# Patient Record
Sex: Male | Born: 1960 | Marital: Married | Smoking: Never smoker
Health system: Southern US, Community
[De-identification: ages and names within clinical notes are randomized; demographics above are authoritative.]

## PROBLEM LIST (undated history)

## (undated) DIAGNOSIS — I10 Essential (primary) hypertension: Secondary | ICD-10-CM

## (undated) DIAGNOSIS — K219 Gastro-esophageal reflux disease without esophagitis: Secondary | ICD-10-CM

## (undated) DIAGNOSIS — M1990 Unspecified osteoarthritis, unspecified site: Secondary | ICD-10-CM

## (undated) DIAGNOSIS — T7840XA Allergy, unspecified, initial encounter: Secondary | ICD-10-CM

## (undated) HISTORY — PX: KNEE ARTHROSCOPY: SUR90

## (undated) HISTORY — PX: ANKLE FRACTURE SURGERY: SHX122

## (undated) HISTORY — DX: Allergy, unspecified, initial encounter: T78.40XA

## (undated) HISTORY — PX: COLONOSCOPY: SHX174

## (undated) HISTORY — DX: Gastro-esophageal reflux disease without esophagitis: K21.9

## (undated) HISTORY — DX: Unspecified osteoarthritis, unspecified site: M19.90

## (undated) HISTORY — DX: Essential (primary) hypertension: I10

---

## 2001-05-23 ENCOUNTER — Emergency Department (HOSPITAL_COMMUNITY): Admission: EM | Admit: 2001-05-23 | Discharge: 2001-05-23 | Payer: Self-pay | Admitting: *Deleted

## 2001-07-29 ENCOUNTER — Encounter: Admission: RE | Admit: 2001-07-29 | Discharge: 2001-10-27 | Payer: Self-pay | Admitting: Orthopedic Surgery

## 2002-06-21 ENCOUNTER — Emergency Department (HOSPITAL_COMMUNITY): Admission: EM | Admit: 2002-06-21 | Discharge: 2002-06-21 | Payer: Self-pay | Admitting: Emergency Medicine

## 2002-06-28 ENCOUNTER — Emergency Department (HOSPITAL_COMMUNITY): Admission: EM | Admit: 2002-06-28 | Discharge: 2002-06-28 | Payer: Self-pay | Admitting: Emergency Medicine

## 2006-03-29 ENCOUNTER — Emergency Department (HOSPITAL_COMMUNITY): Admission: EM | Admit: 2006-03-29 | Discharge: 2006-03-29 | Payer: Self-pay | Admitting: Family Medicine

## 2006-04-12 ENCOUNTER — Emergency Department (HOSPITAL_COMMUNITY): Admission: EM | Admit: 2006-04-12 | Discharge: 2006-04-12 | Payer: Self-pay | Admitting: Family Medicine

## 2008-01-16 ENCOUNTER — Ambulatory Visit: Payer: Self-pay | Admitting: Gastroenterology

## 2008-01-27 ENCOUNTER — Ambulatory Visit: Payer: Self-pay | Admitting: Gastroenterology

## 2008-07-28 ENCOUNTER — Ambulatory Visit: Payer: Self-pay | Admitting: Gastroenterology

## 2008-07-28 DIAGNOSIS — K219 Gastro-esophageal reflux disease without esophagitis: Secondary | ICD-10-CM | POA: Insufficient documentation

## 2008-07-29 ENCOUNTER — Telehealth: Payer: Self-pay | Admitting: Gastroenterology

## 2008-07-29 ENCOUNTER — Encounter: Payer: Self-pay | Admitting: Gastroenterology

## 2010-10-11 NOTE — Miscellaneous (Signed)
 Summary: nexium   Clinical Lists Changes  Medications: Rx of NEXIUM 40 MG  CPDR (ESOMEPRAZOLE MAGNESIUM) 1 capsule each day 30 minutes before meal;  #30 x 11;  Signed;  Entered by: Milus Alpha CMA;  Authorized by: Janel Medford MD;  Method used: Electronically to [facility] # [phone]*, [address] [place], [place], [place]  [address], Ph: [phone], Fax: [phone]    Prescriptions: NEXIUM 40 MG  CPDR (ESOMEPRAZOLE MAGNESIUM) 1 capsule each day 30 minutes before meal  #30 x 11   Entered by:   Milus Alpha CMA   Authorized by:   Janel Medford MD   Signed by:   Milus Alpha CMA on 07/29/2008   Method used:   Electronically to        [facility] # [id number][facility] (retail)       [address] [place], [place]  [address]       Ph: [phone]       Fax: [phone]   RxID:   [id number]

## 2010-10-11 NOTE — Progress Notes (Signed)
 Summary: NEXIUM  Phone Note Call from Patient Call back at [phone]   Caller: Patient Call For: Percival Glasheen Reason for Call: Refill Medication Details for Reason: NEXIUM Summary of Call: PT WAS IN YEST FOR APPT AND NEXIUM [facility] [address]...PT SAID IT IS NOT THERE FOR PICK UP  AND PHARMACY SAID NEVER RECEIVED ORDER...PLEASE SEND AND CALL PT WHEN DONE Initial call taken by: Helena Loach,  July 29, 2008 11:57 AM      Prescriptions: NEXIUM 40 MG  CPDR (ESOMEPRAZOLE MAGNESIUM) 1 capsule each day 30 minutes before meal  #30 x 11   Entered by:   Milus Alpha CMA   Authorized by:   Janel Medford MD   Signed by:   Milus Alpha CMA on 07/29/2008   Method used:   Electronically to        [facility] [address] [facility]* (retail)       [address]       [place], [place]  [address]       Ph: [phone] or [phone]       Fax: [phone]   RxID:   [id number]   Appended Document: NEXIUM refill to wrong pharmacy called and cancelled [facility] and [facility] [facility] aware

## 2010-10-11 NOTE — Assessment & Plan Note (Signed)
 History of Present Illness Visit Type: consult Primary GI MD: Hoyt Macleod MD Primary Provider: Rosslyn Coons, MD Requesting Provider: Rosslyn Coons, MD Chief Complaint: acid reflux History of Present Illness:    very pleasant 50 year old man who has had Belching, severe pyrosis, almost feels likely a mild SOB.  Feels this nearly daily.  Was given samples of PPI, these helped alot.  Symptoms returned when he stopped the med samples.  First noted these symptoms summer 2008.  Certain foods (strawberry, fruits) would cause bloating, nause.  No dsyphagia, no odynophagia.  Stable weight.  No vomitting, no overt bleeding.  Rare NSAIDs.  Eats dinner around 7-8, lays down around 11am.               Updated Prior Medication List: ASPIRIN 81 MG  TABS (ASPIRIN) 1 by mouth once daily CLARITIN 10 MG CAPS (LORATADINE) 1 by mouth every other day BENICAR 40 MG TABS (OLMESARTAN MEDOXOMIL) once daily  Current Allergies (reviewed today): ! PCN  Past Medical History:    Hemorrhoids    hypertension  Past Surgical History:    colonoscopy 2009 by Dr. Alisia Irons no polyps or cancers. Next colonoscopy 2019.   Family History:    paternal grandfather had colon cancer  Social History:    he is married has 2 children he works as a Psychologist, occupational for [employer] he does not smoke cigarettes, he does not drink alcohol, he drinks 2 sodas a day.    Review of Systems       Pertinent positive and negative review of systems were noted in the above HPI and GI specific review of systems.  All other review of systems was otherwise negative.    Vital Signs:  Patient Profile:   49 Years Old Male Height:     73 inches Weight:      202.50 pounds BMI:     26.81 Pulse rate:   60 / minute Pulse rhythm:   regular BP sitting:   142 / 100  (left arm)  Vitals Entered By: June McMurray CMA (July 28, 2008 2:27 PM)                  Physical Exam  Constitutional: generally well appearing Psychiatric:  alert and oriented times 3 Eyes: extraocular movements intact Mouth: oropharynx moist, no lesions Neck: supple, no lymphadenopathy Cardiovascular: heart regular rate and rythm Lungs: CTA bilaterally Abdomen: soft, non-tender, non-distended, no obvious ascites, no peritoneal signs, normal bowel sounds Extremities: no lower extremity edema bilaterally Skin: no lesions on visible extremities     Impression & Recommendations:  Problem # 1:  GERD (ICD-530.81) fairly classic GERD symptoms that seem to be well controlled on PPI once daily. I will call him in a prescription for Nexium one pill once daily to be taken 20-30 minutes prior to his breakfast meal. He has been given a GERD handout and we will arrange for him to have an EGD performed at his soonest convenience to screen for complications of GERarrett's, strictures, cancer.   Patient Instructions: 1)  GERD handout. 2)  Start nexium one pill once daily, best taken 20-30 min before breakfast meal. 3)  You will be scheduled to have an upper endoscopy. 4)  A copy of this information will be sent to Dr. Efraim Grange.    Prescriptions: NEXIUM 40 MG  CPDR (ESOMEPRAZOLE MAGNESIUM) 1 capsule each day 30 minutes before meal  #30 x 11   Entered and Authorized by:   Janel Medford MD  Signed by:   Janel Medford MD on 07/28/2008   Method used:   Historical   RxID:   [id number]  ]  Appended Document: Orders Update/EGD    Clinical Lists Changes  Orders: Added new Test order of EGD (EGD) - Signed

## 2010-10-11 NOTE — Procedures (Signed)
 Summary: colonscopy   Colonoscopy  Procedure date:  01/27/2008  Findings:      Location:  [facility].    Procedures Next Due Date:    Colonoscopy: 01/2018  Patient Name: Drew Martinez, Drew Martinez MRN:  Procedure Procedures: Colonoscopy CPT: 16109.  Personnel: Endoscopist: Janel Medford, MD.  Referred By: Cris Dollar. Efraim Grange, MD.  Exam Location: Exam performed in Endoscopy Suite. Outpatient  Patient Consent: Procedure, Alternatives, Risks and Benefits discussed, consent obtained, from patient. Consent was obtained by the RN.  Indications Symptoms: Hematochezia.  Comments: INTERMITTENT (RARE, 1-2 TIMES/YEAR) RECTAL BLEEDING, OFTEN AROUND TIME OF CONTIPATION History  Current Medications: Patient is not currently taking Coumadin.  Comments: Patient history reviewed and updated, pre-procedure physical performed prior to initiation of sedation? yes Pre-Exam Physical: Performed Jan 27, 2008. Cardio-pulmonary exam, Abdominal exam, Mental status exam WNL.  Exam Exam: Extent of exam reached: Cecum, extent intended: Cecum.  The cecum was identified by appendiceal orifice and IC valve. Patient position: on left side. Time to Cecum: 00:02:22. Time for Withdrawl: 00:06:08. Colon retroflexion performed. Images taken. ASA Classification: II. Tolerance: good.  Monitoring: Pulse and BP monitoring, Oximetry used. Supplemental O2 given.  Colon Prep Prep results: good.  Sedation Meds: Patient assessed and found to be appropriate for moderate (conscious) sedation. Fentanyl 50 mcg. given IV. Versed 5 mg. given IV.  Findings - NORMAL EXAM: Cecum to Rectum. Comments: OTHERWISE NORMAL EXAMINATION.  HEMORRHOIDS: External. Size: Small.   Assessment Abnormal examination, see findings above.  Comments: NO COLON POLYPS OR CANCERS.  HIS MILD, INTERMITTENT RECTAL BLEEDING IS LIKELY HEMORRHOIDAL.  SHOULD CONTINUE TO FOLLOW CURRENT COLORECTAL CANCER SCREENING GUIDELINES WITH A  REPEAT COLONOSCOPY IN 10 YEARS. Events  Unplanned Interventions: No intervention was required.  Unplanned Events: There were no complications. Plans Comments: COLONOSCOPY IN 10 YEARS   cc.Cris Dollar Paterson,MD  This report was created from the original endoscopy report, which was reviewed and signed by the above listed endoscopist.

## 2010-10-11 NOTE — Miscellaneous (Signed)
 Summary: GI Previsit  Clinical Lists Changes  Medications: Added new medication of MOVIPREP 100 GM  SOLR (PEG-KCL-NACL-NASULF-NA ASC-C) As per prep instructions. - Signed Rx of MOVIPREP 100 GM  SOLR (PEG-KCL-NACL-NASULF-NA ASC-C) As per prep instructions.;  #1 x 0;  Signed;  Entered by: Ericka Hauser RN;  Authorized by: Janel Medford MD;  Method used: Electronic Allergies: Added new allergy or adverse reaction of PCN Observations: Added new observation of NKA: F (01/16/2008 8:21)    Prescriptions: MOVIPREP 100 GM  SOLR (PEG-KCL-NACL-NASULF-NA ASC-C) As per prep instructions.  #1 x 0   Entered by:   Ericka Hauser RN   Authorized by:   Janel Medford MD   Signed by:   Ericka Hauser RN on 01/16/2008   Method used:   [facility] ...       [phone]       [address]       [place], [place]  [address]       Ph: [phone]       Fax: [phone]   RxID:   [id number]

## 2011-01-27 NOTE — Consult Note (Signed)
NAME:  Drew Martinez, Drew Martinez NO.:  [id number]   MEDICAL RECORD NO.:  [id number]          PATIENT TYPE:  EMS   LOCATION:  [facility]                         FACILITY:  [facility]   PHYSICIAN:  Elmore Guise., M.D.DATE OF BIRTH:  [birth year]   DATE OF CONSULTATION:  04/12/2006  DATE OF DISCHARGE:  04/12/2006                                   CONSULTATION   INDICATION FOR EVALUATION:  Chest pain, questionable pericarditis.   HISTORY OF PRESENT ILLNESS:  The patient is a very pleasant 50 year old  African-American male with past medical history of hypertension who presents  with sharp and aching left-sided chest pain for the last 36 hours.  The  patient states normal state of health; however, over the last 3-6 hours, he  has been having malaise and viral syndrome like symptoms.  No significant  fever, chills, nausea, vomiting or diarrhea.  However, he has been having  some achy on and off chest pain that has now become sharp and pin-like in  his anterior chest wall.  Denies any shortness of breath.  No exertional  symptoms.  He went to the Urgent Care for evaluation.  There EKG showed LVH  with repolarization abnormality.  He was then sent to [facility] ER  for further evaluation.  On arrival here, he has been chest painfree.  A  second EKG shows normal sinus rhythm with early repolarization abnormality  versus pericarditis.  He has had two sets of negative enzymes and is totally  symptom free.  Review of systems are as per HPI.  All others are negative.   CURRENT MEDICINES:  Verapamil 120 mg daily.   ALLERGIES:  PENICILLIN.   FAMILY HISTORY:  Positive for hypertension.   SOCIAL HISTORY:  He is married.  He has two children.  No tobacco or  alcohol.   PHYSICAL EXAMINATION:  His initial blood pressure 160/100 which improved  with rest to normal range.  His heart rate is in the 60 and 70s.  He is  saturating 98% on room air.  In general, he is a very pleasant  African-  American male, alert and oriented x4 in no acute distress.  He has no JVD  and no bruits.  Lungs are clear.  Heart is regular with a normal S1-S2.  No  rub noted.  Abdomen is soft, nontender, nondistended.  No rebound or  guarding.  No abdominal bruits.  Extremities are warm with 2+ pulses and no  significant edema.   His blood work was reviewed and showed a BUN and creatinine 15 and 1.1,  potassium 4.1.  Hemoglobin 14.1, platelet count 231, white count of 4.9.  Cardiac markers are negative with myoglobin of 90.5, 114.  MBs of 3.0, 3.3  and troponin I less than 0.05 x2.  Chest x-ray shows no acute  cardiopulmonary disease.  ECG shows normal sinus rhythm 62 per minute, LVH  with early repolarization versus possible pericarditis.   IMPRESSION:  1.  Possible pericarditis versus early repolarization pattern.  2.  History of hypertension.   PLAN:  At this time,  I would treat his symptoms with aspirin high dose at  650 mg twice daily.  He will continue his verapamil at 120 mg daily.  He  will have one additional set of cardiac enzymes if these are negative.  It  is safe to discharge home since he is completely asymptomatic at this time.  He will have a follow-up at our office in 1-2 weeks for echo and further  evaluation.  He is to call if his symptoms return or worsen.      Elmore Guise., M.D.  Electronically Signed     TWK/MEDQ  D:  04/12/2006  T:  04/12/2006  Job:  [id number]   cc:   Barry Dienes. Eloise Harman, M.D.

## 2015-05-07 ENCOUNTER — Encounter: Payer: Self-pay | Admitting: Gastroenterology

## 2017-05-15 DIAGNOSIS — I10 Essential (primary) hypertension: Secondary | ICD-10-CM | POA: Diagnosis not present

## 2017-05-15 DIAGNOSIS — Z6829 Body mass index (BMI) 29.0-29.9, adult: Secondary | ICD-10-CM | POA: Diagnosis not present

## 2017-06-19 DIAGNOSIS — M545 Low back pain: Secondary | ICD-10-CM | POA: Diagnosis not present

## 2017-06-19 DIAGNOSIS — R7301 Impaired fasting glucose: Secondary | ICD-10-CM | POA: Diagnosis not present

## 2017-06-19 DIAGNOSIS — I10 Essential (primary) hypertension: Secondary | ICD-10-CM | POA: Diagnosis not present

## 2017-06-19 DIAGNOSIS — E7849 Other hyperlipidemia: Secondary | ICD-10-CM | POA: Diagnosis not present

## 2017-06-19 DIAGNOSIS — Z23 Encounter for immunization: Secondary | ICD-10-CM | POA: Diagnosis not present

## 2017-09-18 DIAGNOSIS — M545 Low back pain: Secondary | ICD-10-CM | POA: Diagnosis not present

## 2017-09-18 DIAGNOSIS — Z1389 Encounter for screening for other disorder: Secondary | ICD-10-CM | POA: Diagnosis not present

## 2017-09-18 DIAGNOSIS — J302 Other seasonal allergic rhinitis: Secondary | ICD-10-CM | POA: Diagnosis not present

## 2017-09-18 DIAGNOSIS — I10 Essential (primary) hypertension: Secondary | ICD-10-CM | POA: Diagnosis not present

## 2017-09-18 DIAGNOSIS — R7301 Impaired fasting glucose: Secondary | ICD-10-CM | POA: Diagnosis not present

## 2017-12-24 ENCOUNTER — Encounter: Payer: Self-pay | Admitting: Gastroenterology

## 2017-12-26 DIAGNOSIS — Z0000 Encounter for general adult medical examination without abnormal findings: Secondary | ICD-10-CM | POA: Diagnosis not present

## 2017-12-26 DIAGNOSIS — R7301 Impaired fasting glucose: Secondary | ICD-10-CM | POA: Diagnosis not present

## 2017-12-26 DIAGNOSIS — R82998 Other abnormal findings in urine: Secondary | ICD-10-CM | POA: Diagnosis not present

## 2017-12-26 DIAGNOSIS — I10 Essential (primary) hypertension: Secondary | ICD-10-CM | POA: Diagnosis not present

## 2017-12-26 DIAGNOSIS — Z125 Encounter for screening for malignant neoplasm of prostate: Secondary | ICD-10-CM | POA: Diagnosis not present

## 2018-01-01 DIAGNOSIS — R7301 Impaired fasting glucose: Secondary | ICD-10-CM | POA: Diagnosis not present

## 2018-01-01 DIAGNOSIS — E7849 Other hyperlipidemia: Secondary | ICD-10-CM | POA: Diagnosis not present

## 2018-01-01 DIAGNOSIS — Z0000 Encounter for general adult medical examination without abnormal findings: Secondary | ICD-10-CM | POA: Diagnosis not present

## 2018-01-01 DIAGNOSIS — I10 Essential (primary) hypertension: Secondary | ICD-10-CM | POA: Diagnosis not present

## 2018-01-01 DIAGNOSIS — G4709 Other insomnia: Secondary | ICD-10-CM | POA: Diagnosis not present

## 2018-01-02 ENCOUNTER — Encounter: Payer: Self-pay | Admitting: Gastroenterology

## 2018-01-11 DIAGNOSIS — Z1212 Encounter for screening for malignant neoplasm of rectum: Secondary | ICD-10-CM | POA: Diagnosis not present

## 2018-01-30 DIAGNOSIS — I10 Essential (primary) hypertension: Secondary | ICD-10-CM | POA: Diagnosis not present

## 2018-01-30 DIAGNOSIS — Z6830 Body mass index (BMI) 30.0-30.9, adult: Secondary | ICD-10-CM | POA: Diagnosis not present

## 2018-01-30 DIAGNOSIS — M545 Low back pain: Secondary | ICD-10-CM | POA: Diagnosis not present

## 2018-01-31 ENCOUNTER — Other Ambulatory Visit: Payer: Self-pay | Admitting: Internal Medicine

## 2018-01-31 DIAGNOSIS — G8929 Other chronic pain: Secondary | ICD-10-CM

## 2018-01-31 DIAGNOSIS — M545 Low back pain: Principal | ICD-10-CM

## 2018-02-15 ENCOUNTER — Ambulatory Visit
Admission: RE | Admit: 2018-02-15 | Discharge: 2018-02-15 | Disposition: A | Discharge status: Home / Self Care | Payer: BLUE CROSS/BLUE SHIELD | Source: Ambulatory Visit | Attending: Internal Medicine | Admitting: Internal Medicine

## 2018-02-15 DIAGNOSIS — G8929 Other chronic pain: Secondary | ICD-10-CM

## 2018-02-15 DIAGNOSIS — M545 Low back pain: Principal | ICD-10-CM

## 2018-02-15 DIAGNOSIS — M5126 Other intervertebral disc displacement, lumbar region: Secondary | ICD-10-CM | POA: Diagnosis not present

## 2018-02-15 MED ORDER — METHYLPREDNISOLONE ACETATE 40 MG/ML INJ SUSP (RADIOLOG
120.0000 mg | Freq: Once | INTRAMUSCULAR | Status: AC
  Administered 2018-02-15: 120 mg via EPIDURAL

## 2018-02-15 MED ORDER — IOPAMIDOL (ISOVUE-M 200) INJECTION 41%
1.0000 mL | Freq: Once | INTRAMUSCULAR | Status: AC
  Administered 2018-02-15: 1 mL via EPIDURAL

## 2018-02-15 NOTE — Discharge Instructions (Signed)

## 2018-02-18 ENCOUNTER — Other Ambulatory Visit: Payer: Self-pay | Admitting: *Deleted

## 2018-03-07 ENCOUNTER — Other Ambulatory Visit: Payer: Self-pay

## 2018-03-07 ENCOUNTER — Ambulatory Visit (AMBULATORY_SURGERY_CENTER): Payer: Self-pay

## 2018-03-07 VITALS — Ht 72.0 in | Wt 225.8 lb

## 2018-03-07 DIAGNOSIS — Z1211 Encounter for screening for malignant neoplasm of colon: Secondary | ICD-10-CM

## 2018-03-07 MED ORDER — NA SULFATE-K SULFATE-MG SULF 17.5-3.13-1.6 GM/177ML PO SOLN: 1.0000 | Freq: Once | ORAL | 0 refills | Status: AC

## 2018-03-07 NOTE — Progress Notes (Signed)
No egg or soy allergy known to patient  No issues with past sedation with any surgeries  or procedures, no intubation problems  No diet pills per patient No home 02 use per patient  No blood thinners per patient  Pt denies issues with constipation  No A fib or A flutter  EMMI video sent to pt's e mail , pt declined    

## 2018-03-20 ENCOUNTER — Ambulatory Visit: Payer: BLUE CROSS/BLUE SHIELD | Admitting: Gastroenterology

## 2018-03-20 ENCOUNTER — Encounter: Payer: Self-pay | Admitting: Gastroenterology

## 2018-03-20 VITALS — BP 121/79 | HR 67 | Temp 97.3°F | Resp 25 | Ht 72.0 in | Wt 225.0 lb

## 2018-03-20 DIAGNOSIS — Z1211 Encounter for screening for malignant neoplasm of colon: Secondary | ICD-10-CM | POA: Diagnosis not present

## 2018-03-20 DIAGNOSIS — D124 Benign neoplasm of descending colon: Secondary | ICD-10-CM

## 2018-03-20 DIAGNOSIS — D123 Benign neoplasm of transverse colon: Secondary | ICD-10-CM

## 2018-03-20 MED ORDER — SODIUM CHLORIDE 0.9 % IV SOLN: 500.0000 mL | Freq: Once | INTRAVENOUS | Status: DC

## 2018-03-20 NOTE — Progress Notes (Signed)
Called to room to assist during endoscopic procedure.  Patient ID and intended procedure confirmed with present staff. Received instructions for my participation in the procedure from the performing physician.  

## 2018-03-20 NOTE — Op Note (Signed)
[facility] Patient Name: Drew Martinez Procedure Date: 03/20/2018 10:37 AM MRN: [id number] Endoscopist: Milus Banister , MD Age: 57 Referring MD:  Date of Birth: 05-Mar-1961 Gender: Male Account #: [id number] Procedure:                Colonoscopy Indications:              Screening for colorectal malignant neoplasm Medicines:                Monitored Anesthesia Care Procedure:                Pre-Anesthesia Assessment:                           - Prior to the procedure, a History and Physical                            was performed, and patient medications and                            allergies were reviewed. The patient's tolerance of                            previous anesthesia was also reviewed. The risks                            and benefits of the procedure and the sedation                            options and risks were discussed with the patient.                            All questions were answered, and informed consent                            was obtained. Prior Anticoagulants: The patient has                            taken no previous anticoagulant or antiplatelet                            agents. ASA Grade Assessment: II - A patient with                            mild systemic disease. After reviewing the risks                            and benefits, the patient was deemed in                            satisfactory condition to undergo the procedure.                           After obtaining informed consent, the colonoscope  was passed under direct vision. Throughout the                            procedure, the patient's blood pressure, pulse, and                            oxygen saturations were monitored continuously. The                            Colonoscope was introduced through the anus and                            advanced to the the cecum, identified by                            appendiceal orifice and  ileocecal valve. The                            colonoscopy was performed without difficulty. The                            patient tolerated the procedure well. The quality                            of the bowel preparation was good. The ileocecal                            valve, appendiceal orifice, and rectum were                            photographed. Scope In: 10:50:45 AM Scope Out: 11:01:43 AM Scope Withdrawal Time: 0 hours 8 minutes 20 seconds  Total Procedure Duration: 0 hours 10 minutes 58 seconds  Findings:                 Two sessile polyps were found in the descending                            colon and transverse colon. The polyps were 3 to 4                            mm in size. These polyps were removed with a cold                            snare. Resection and retrieval were complete.                           The exam was otherwise without abnormality on                            direct and retroflexion views. Complications:            No immediate complications. Estimated blood loss:  None. Estimated Blood Loss:     Estimated blood loss: none. Impression:               - Two 3 to 4 mm polyps in the descending colon and                            in the transverse colon, removed with a cold snare.                            Resected and retrieved.                           - The examination was otherwise normal on direct                            and retroflexion views. Recommendation:           - Patient has a contact number available for                            emergencies. The signs and symptoms of potential                            delayed complications were discussed with the                            patient. Return to normal activities tomorrow.                            Written discharge instructions were provided to the                            patient.                           - Resume previous diet.                            - Continue present medications.                           You will receive a letter within 2-3 weeks with the                            pathology results and my final recommendations.                           If the polyp(s) is proven to be 'pre-cancerous' on                            pathology, you will need repeat colonoscopy in 5                            years. If the polyp(s) is NOT 'precancerous' on  pathology then you should repeat colon cancer                            screening in 10 years with colonoscopy without need                            for colon cancer screening by any method prior to                            then (including stool testing). Milus Banister, MD 03/20/2018 11:04:19 AM This report has been signed electronically.

## 2018-03-20 NOTE — Progress Notes (Signed)
Report to PACU, RN, vss, BBS= Clear.  

## 2018-03-20 NOTE — Patient Instructions (Signed)
**   Handouts given on polyps **   YOU HAD AN ENDOSCOPIC PROCEDURE TODAY AT THE [facility]:   Refer to the procedure report that was given to you for any specific questions about what was found during the examination.  If the procedure report does not answer your questions, please call your gastroenterologist to clarify.  If you requested that your care partner not be given the details of your procedure findings, then the procedure report has been included in a sealed envelope for you to review at your convenience later.  YOU SHOULD EXPECT: Some feelings of bloating in the abdomen. Passage of more gas than usual.  Walking can help get rid of the air that was put into your GI tract during the procedure and reduce the bloating. If you had a lower endoscopy (such as a colonoscopy or flexible sigmoidoscopy) you may notice spotting of blood in your stool or on the toilet paper. If you underwent a bowel prep for your procedure, you may not have a normal bowel movement for a few days.  Please Note:  You might notice some irritation and congestion in your nose or some drainage.  This is from the oxygen used during your procedure.  There is no need for concern and it should clear up in a day or so.  SYMPTOMS TO REPORT IMMEDIATELY:   Following lower endoscopy (colonoscopy or flexible sigmoidoscopy):  Excessive amounts of blood in the stool  Significant tenderness or worsening of abdominal pains  Swelling of the abdomen that is new, acute  Fever of 100F or higher  For urgent or emergent issues, a gastroenterologist can be reached at any hour by calling [phone].   DIET:  We do recommend a small meal at first, but then you may proceed to your regular diet.  Drink plenty of fluids but you should avoid alcoholic beverages for 24 hours.  ACTIVITY:  You should plan to take it easy for the rest of today and you should NOT DRIVE or use heavy machinery until tomorrow (because of the sedation  medicines used during the test).    FOLLOW UP: Our staff will call the number listed on your records the next business day following your procedure to check on you and address any questions or concerns that you may have regarding the information given to you following your procedure. If we do not reach you, we will leave a message.  However, if you are feeling well and you are not experiencing any problems, there is no need to return our call.  We will assume that you have returned to your regular daily activities without incident.  If any biopsies were taken you will be contacted by phone or by letter within the next 1-3 weeks.  Please call us at [phone] if you have not heard about the biopsies in 3 weeks.    SIGNATURES/CONFIDENTIALITY: You and/or your care partner have signed paperwork which will be entered into your electronic medical record.  These signatures attest to the fact that that the information above on your After Visit Summary has been reviewed and is understood.  Full responsibility of the confidentiality of this discharge information lies with you and/or your care-partner. 

## 2018-03-21 ENCOUNTER — Telehealth: Payer: Self-pay | Admitting: *Deleted

## 2018-03-21 NOTE — Telephone Encounter (Signed)
  Follow up Call-  Call back number 03/20/2018  Post procedure Call Back phone  # [phone]  Permission to leave phone message Yes  Some recent data might be hidden     Patient questions:  Message left to call us if necessary.

## 2018-03-21 NOTE — Telephone Encounter (Signed)
  Follow up Call-  Call back number 03/20/2018  Post procedure Call Back phone  # [phone]  Permission to leave phone message Yes  Some recent data might be hidden     Patient questions:  Do you have a fever, pain , or abdominal swelling? No. Pain Score  0 *  Have you tolerated food without any problems? Yes.    Have you been able to return to your normal activities? Yes.    Do you have any questions about your discharge instructions: Diet   No. Medications  No. Follow up visit  No.  Do you have questions or concerns about your Care? No.  Actions: * If pain score is 4 or above: No action needed, pain <4.

## 2018-03-24 ENCOUNTER — Encounter: Payer: Self-pay | Admitting: Gastroenterology

## 2018-05-18 DIAGNOSIS — Z23 Encounter for immunization: Secondary | ICD-10-CM | POA: Diagnosis not present

## 2018-08-01 DIAGNOSIS — M19072 Primary osteoarthritis, left ankle and foot: Secondary | ICD-10-CM | POA: Diagnosis not present

## 2018-08-01 DIAGNOSIS — S63632A Sprain of interphalangeal joint of right middle finger, initial encounter: Secondary | ICD-10-CM | POA: Diagnosis not present

## 2018-08-01 DIAGNOSIS — M5106 Intervertebral disc disorders with myelopathy, lumbar region: Secondary | ICD-10-CM | POA: Diagnosis not present

## 2018-08-05 DIAGNOSIS — M545 Low back pain: Secondary | ICD-10-CM | POA: Diagnosis not present

## 2018-08-05 DIAGNOSIS — M5414 Radiculopathy, thoracic region: Secondary | ICD-10-CM | POA: Diagnosis not present

## 2018-08-05 DIAGNOSIS — M9904 Segmental and somatic dysfunction of sacral region: Secondary | ICD-10-CM | POA: Diagnosis not present

## 2018-08-05 DIAGNOSIS — M9903 Segmental and somatic dysfunction of lumbar region: Secondary | ICD-10-CM | POA: Diagnosis not present

## 2018-08-19 DIAGNOSIS — M5416 Radiculopathy, lumbar region: Secondary | ICD-10-CM | POA: Diagnosis not present

## 2018-11-19 ENCOUNTER — Other Ambulatory Visit: Payer: Self-pay | Admitting: Internal Medicine

## 2018-11-19 DIAGNOSIS — M545 Low back pain, unspecified: Secondary | ICD-10-CM

## 2018-11-19 DIAGNOSIS — G8929 Other chronic pain: Secondary | ICD-10-CM

## 2018-11-21 ENCOUNTER — Other Ambulatory Visit: Payer: Self-pay

## 2018-11-21 ENCOUNTER — Other Ambulatory Visit: Payer: BLUE CROSS/BLUE SHIELD

## 2018-11-21 ENCOUNTER — Ambulatory Visit
Admission: RE | Admit: 2018-11-21 | Discharge: 2018-11-21 | Disposition: A | Discharge status: Home / Self Care | Payer: BLUE CROSS/BLUE SHIELD | Source: Ambulatory Visit | Attending: Internal Medicine | Admitting: Internal Medicine

## 2018-11-21 DIAGNOSIS — G8929 Other chronic pain: Secondary | ICD-10-CM

## 2018-11-21 DIAGNOSIS — M47817 Spondylosis without myelopathy or radiculopathy, lumbosacral region: Secondary | ICD-10-CM | POA: Diagnosis not present

## 2018-11-21 DIAGNOSIS — M545 Low back pain, unspecified: Secondary | ICD-10-CM

## 2018-11-21 MED ORDER — IOPAMIDOL (ISOVUE-M 200) INJECTION 41%
1.0000 mL | Freq: Once | INTRAMUSCULAR | Status: AC
  Administered 2018-11-21: 1 mL via EPIDURAL

## 2018-11-21 MED ORDER — METHYLPREDNISOLONE ACETATE 40 MG/ML INJ SUSP (RADIOLOG
120.0000 mg | Freq: Once | INTRAMUSCULAR | Status: AC
  Administered 2018-11-21: 120 mg via EPIDURAL

## 2018-11-21 NOTE — Discharge Instructions (Signed)

## 2019-03-31 ENCOUNTER — Other Ambulatory Visit: Payer: Self-pay | Admitting: *Deleted

## 2019-03-31 DIAGNOSIS — Z20822 Contact with and (suspected) exposure to covid-19: Secondary | ICD-10-CM

## 2019-04-05 ENCOUNTER — Telehealth: Payer: Self-pay | Admitting: Emergency Medicine

## 2019-04-05 LAB — NOVEL CORONAVIRUS, NAA: SARS-CoV-2, NAA: NOT DETECTED

## 2019-04-05 NOTE — Telephone Encounter (Signed)
Patient returned Drew Martinez call about COVID results.  Identified patient using two identifiers, and provided negative result.  Patient verbalized understanding.

## 2019-06-05 DIAGNOSIS — S63622A Sprain of interphalangeal joint of left thumb, initial encounter: Secondary | ICD-10-CM | POA: Diagnosis not present

## 2019-06-05 DIAGNOSIS — M19072 Primary osteoarthritis, left ankle and foot: Secondary | ICD-10-CM | POA: Diagnosis not present

## 2019-06-14 DIAGNOSIS — Z23 Encounter for immunization: Secondary | ICD-10-CM | POA: Diagnosis not present

## 2019-07-15 DIAGNOSIS — I10 Essential (primary) hypertension: Secondary | ICD-10-CM | POA: Diagnosis not present

## 2019-07-15 DIAGNOSIS — Z125 Encounter for screening for malignant neoplasm of prostate: Secondary | ICD-10-CM | POA: Diagnosis not present

## 2019-07-15 DIAGNOSIS — R7301 Impaired fasting glucose: Secondary | ICD-10-CM | POA: Diagnosis not present

## 2019-07-15 DIAGNOSIS — E7849 Other hyperlipidemia: Secondary | ICD-10-CM | POA: Diagnosis not present

## 2019-07-15 DIAGNOSIS — Z0000 Encounter for general adult medical examination without abnormal findings: Secondary | ICD-10-CM | POA: Diagnosis not present

## 2019-07-22 DIAGNOSIS — G4700 Insomnia, unspecified: Secondary | ICD-10-CM | POA: Diagnosis not present

## 2019-07-22 DIAGNOSIS — R7301 Impaired fasting glucose: Secondary | ICD-10-CM | POA: Diagnosis not present

## 2019-07-22 DIAGNOSIS — Z0000 Encounter for general adult medical examination without abnormal findings: Secondary | ICD-10-CM | POA: Diagnosis not present

## 2019-07-22 DIAGNOSIS — E785 Hyperlipidemia, unspecified: Secondary | ICD-10-CM | POA: Diagnosis not present

## 2019-07-22 DIAGNOSIS — Z1331 Encounter for screening for depression: Secondary | ICD-10-CM | POA: Diagnosis not present

## 2019-07-22 DIAGNOSIS — I10 Essential (primary) hypertension: Secondary | ICD-10-CM | POA: Diagnosis not present

## 2019-09-10 DIAGNOSIS — M7752 Other enthesopathy of left foot: Secondary | ICD-10-CM | POA: Diagnosis not present

## 2019-09-10 DIAGNOSIS — M19072 Primary osteoarthritis, left ankle and foot: Secondary | ICD-10-CM | POA: Diagnosis not present

## 2019-09-10 DIAGNOSIS — M67372 Transient synovitis, left ankle and foot: Secondary | ICD-10-CM | POA: Diagnosis not present

## 2019-09-10 DIAGNOSIS — M109 Gout, unspecified: Secondary | ICD-10-CM | POA: Diagnosis not present

## 2019-09-15 DIAGNOSIS — M109 Gout, unspecified: Secondary | ICD-10-CM | POA: Diagnosis not present

## 2019-09-15 DIAGNOSIS — M7752 Other enthesopathy of left foot: Secondary | ICD-10-CM | POA: Diagnosis not present

## 2019-10-13 DIAGNOSIS — M7752 Other enthesopathy of left foot: Secondary | ICD-10-CM | POA: Diagnosis not present

## 2019-10-13 DIAGNOSIS — M109 Gout, unspecified: Secondary | ICD-10-CM | POA: Diagnosis not present

## 2019-10-23 DIAGNOSIS — S9002XA Contusion of left ankle, initial encounter: Secondary | ICD-10-CM | POA: Diagnosis not present

## 2019-10-27 DIAGNOSIS — S96212D Strain of intrinsic muscle and tendon at ankle and foot level, left foot, subsequent encounter: Secondary | ICD-10-CM | POA: Diagnosis not present

## 2019-10-27 DIAGNOSIS — M25572 Pain in left ankle and joints of left foot: Secondary | ICD-10-CM | POA: Diagnosis not present

## 2019-10-27 DIAGNOSIS — S9002XD Contusion of left ankle, subsequent encounter: Secondary | ICD-10-CM | POA: Diagnosis not present

## 2019-10-27 DIAGNOSIS — M25472 Effusion, left ankle: Secondary | ICD-10-CM | POA: Diagnosis not present

## 2019-11-20 ENCOUNTER — Ambulatory Visit: Payer: BC Managed Care – PPO | Attending: Internal Medicine

## 2019-11-20 DIAGNOSIS — Z23 Encounter for immunization: Secondary | ICD-10-CM

## 2019-11-20 NOTE — Progress Notes (Signed)
   Covid-19 Vaccination Clinic  Name:  Drew Martinez    MRN: [id number] DOB: Apr 08, 1961  11/20/2019  Drew Martinez was observed post Covid-19 immunization for 15 minutes without incident. He was provided with Vaccine Information Sheet and instruction to access the V-Safe system.   Drew Martinez was instructed to call 911 with any severe reactions post vaccine: [name] Kitchen Difficulty breathing  . Swelling of face and throat  . A fast heartbeat  . A bad rash all over body  . Dizziness and weakness   Immunizations Administered    Name Date Dose VIS Date Route   Pfizer COVID-19 Vaccine 11/20/2019 12:28 PM 0.3 mL 08/22/2019 Intramuscular   Manufacturer: [place]   Lot: [id number]    [place]: [id number]

## 2019-12-15 ENCOUNTER — Ambulatory Visit: Payer: BC Managed Care – PPO | Attending: Internal Medicine

## 2019-12-15 DIAGNOSIS — Z23 Encounter for immunization: Secondary | ICD-10-CM

## 2019-12-15 NOTE — Progress Notes (Signed)
   Covid-19 Vaccination Clinic  Name:  Drew Martinez    MRN: [id number] DOB: 1961/04/28  12/15/2019  [name] was observed post Covid-19 immunization for 15 minutes without incident. He was provided with Vaccine Information Sheet and instruction to access the V-Safe system.   [name] was instructed to call 911 with any severe reactions post vaccine: [name] Kitchen Difficulty breathing  . Swelling of face and throat  . A fast heartbeat  . A bad rash all over body  . Dizziness and weakness   Immunizations Administered    Name Date Dose VIS Date Route   Pfizer COVID-19 Vaccine 12/15/2019  9:55 AM 0.3 mL 08/22/2019 Intramuscular   Manufacturer: [place]   Lot: [id number]   [place]: [id number]

## 2020-04-14 IMAGING — XA Imaging study
2 series · 2 of 2 positions shown · non-contrast
Comparison: none

CLINICAL DATA: Lumbosacral spondylosis without myelopathy.
Displacement the L5-S1 lumbar disc.

[Series 1: ortho standard · 1 of 1 slices shown (1 of 2)]
[im 1/1]
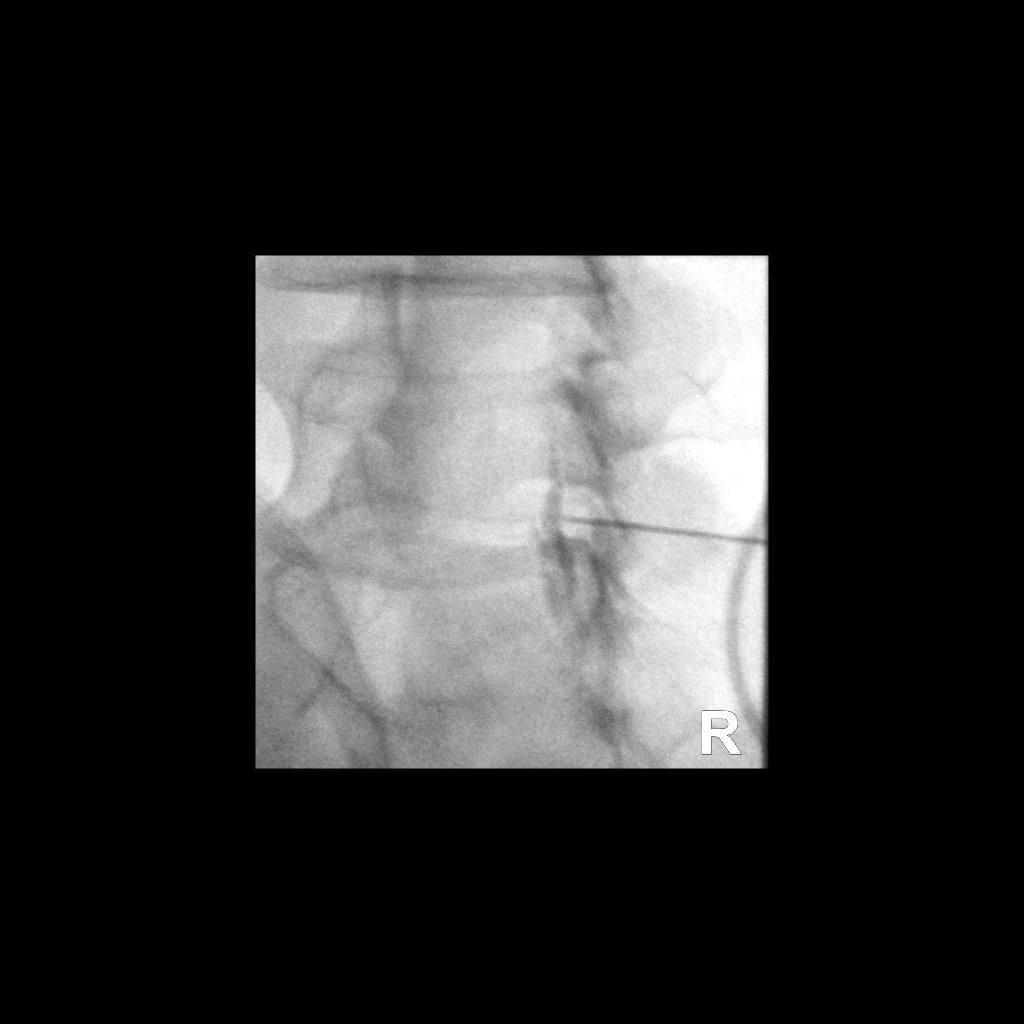

[Series 2: ortho standard · 1 of 1 slices shown (2 of 2)]
[im 1/1]
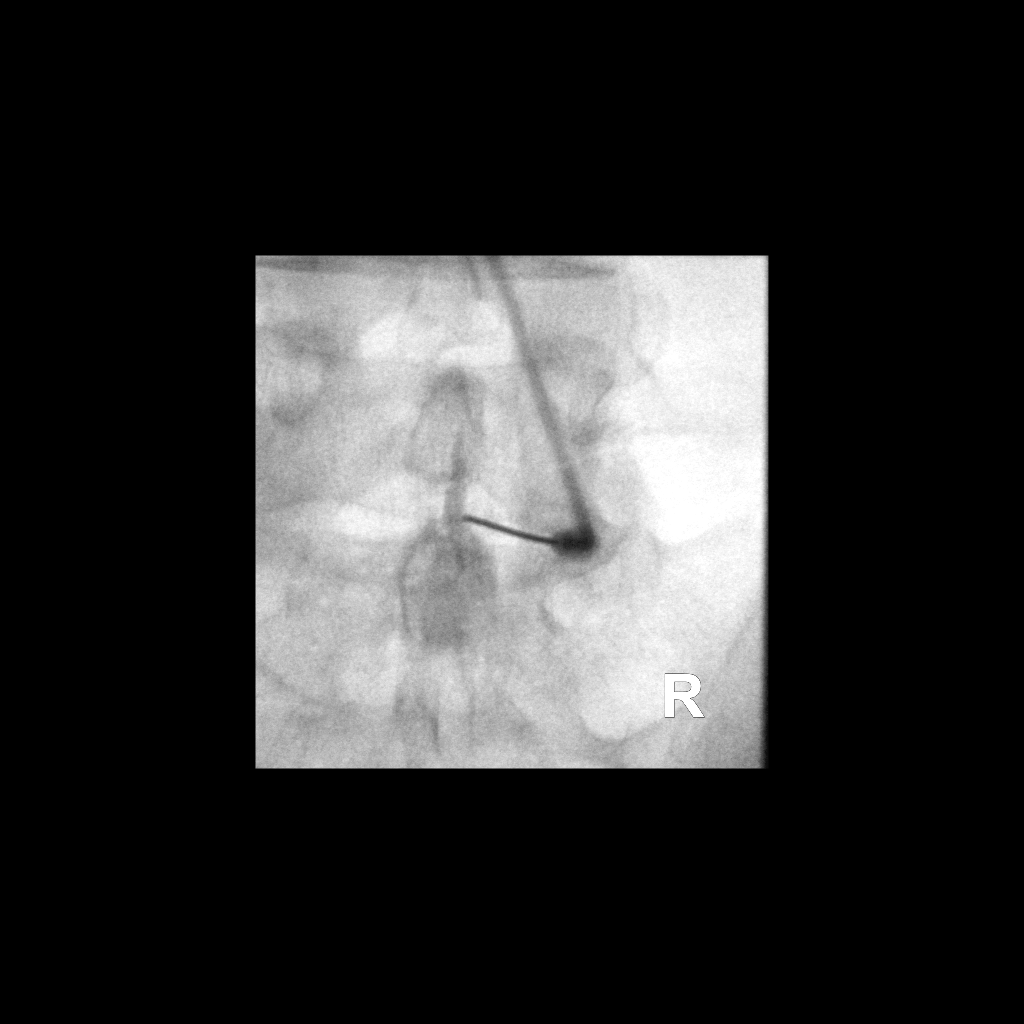

[2 of 2 positions shown; findings below may reference images not displayed]

FLUOROSCOPY TIME:  Radiation Exposure Index (as provided by the
fluoroscopic device): 12.84 uGy*m2

If the device does not provide the exposure index:

Fluoroscopy Time:  14 seconds

Number of Acquired Images:  0

PROCEDURE:
The procedure, risks, benefits, and alternatives were explained to
the patient. Questions regarding the procedure were encouraged and
answered. The patient understands and consents to the procedure.

LUMBAR EPIDURAL INJECTION:

An interlaminar approach was performed on right at L5-S1. The
overlying skin was cleansed and anesthetized. A 20 gauge epidural
needle was advanced using loss-of-resistance technique.

DIAGNOSTIC EPIDURAL INJECTION:

Injection of Isovue-M 200 shows a good epidural pattern with spread
above and below the level of needle placement, primarily on the
right no vascular opacification is seen.

THERAPEUTIC EPIDURAL INJECTION:

120 mg of Depo-Medrol mixed with 1.5 mL 1% lidocaine were instilled.
The procedure was well-tolerated, and the patient was discharged
thirty minutes following the injection in good condition.

COMPLICATIONS:
None
IMPRESSION: Technically successful epidural injection on the right L5-S1 # 1

## 2021-01-10 ENCOUNTER — Other Ambulatory Visit (HOSPITAL_BASED_OUTPATIENT_CLINIC_OR_DEPARTMENT_OTHER): Payer: Self-pay

## 2021-01-10 ENCOUNTER — Ambulatory Visit: Payer: BC Managed Care – PPO | Attending: Internal Medicine

## 2021-01-10 DIAGNOSIS — Z23 Encounter for immunization: Secondary | ICD-10-CM

## 2021-01-10 MED ORDER — PFIZER-BIONT COVID-19 VAC-TRIS 30 MCG/0.3ML IM SUSP
INTRAMUSCULAR | 0 refills | Status: AC
  Filled 2021-01-10: qty 0.3, 1d supply, fill #0

## 2021-01-10 NOTE — Progress Notes (Signed)
   Covid-19 Vaccination Clinic  Name:  Drew Martinez    MRN: [id number] DOB: [birth year]  01/10/2021  [name] was observed post Covid-19 immunization for 15 minutes without incident. He was provided with Vaccine Information Sheet and instruction to access the V-Safe system.   [name] was instructed to call 911 with any severe reactions post vaccine: [name] Kitchen Difficulty breathing  . Swelling of face and throat  . A fast heartbeat  . A bad rash all over body  . Dizziness and weakness   Immunizations Administered    Name Date Dose VIS Date Route   PFIZER Comrnaty(Gray TOP) Covid-19 Vaccine 01/10/2021 12:23 PM 0.3 mL 08/19/2020 Intramuscular   Manufacturer: [place]   Lot: [id number]   NDC: [phone]

## 2021-01-11 ENCOUNTER — Other Ambulatory Visit (HOSPITAL_BASED_OUTPATIENT_CLINIC_OR_DEPARTMENT_OTHER): Payer: Self-pay

## 2021-10-11 ENCOUNTER — Other Ambulatory Visit (HOSPITAL_COMMUNITY): Payer: Self-pay

## 2022-06-08 ENCOUNTER — Encounter: Payer: Self-pay | Admitting: Physician Assistant

## 2022-07-06 NOTE — Progress Notes (Signed)
 07/10/2022 Drew Martinez [id number] 11/19/1960  Referring provider: Bertha Broad, MD Primary GI doctor:Dr. Savannah Curlin (Dr. Howard Macho)  ASSESSMENT AND PLAN:   Rectal bleeding Intermittent rectal bleeding, no change in bowel habits, no rectal pain, but with dark red blood on stool and on TP Will check CBC Most likely hemorrhoids but patient declines rectal exam today and is due 03/2023, will schedule colonoscopy sooner to evaluate We have discussed the risks of bleeding, infection, perforation, medication reactions, and remote risk of death associated with colonoscopy. All questions were answered and the patient acknowledges these risk and wishes to proceed.  History of adenomatous polyp of colon 03/20/2018 colonoscopy for screening purposes 2 tubular adenomatous polyps 3 to 4 mm in size descending and transverse colon otherwise unremarkable.  Recall 5 years  Gastroesophageal reflux disease without esophagitis Lifestyle changes discussed, avoid NSAIDS, ETOH Continue on the same medication, reports symptoms are well controlled.     Patient Care Team: Bertha Broad, MD as PCP - General (Internal Medicine)  HISTORY OF PRESENT ILLNESS: 61 y.o. male with a past medical history of GERD, hypertension, hyperlipidemia, and others listed below presents for evaluation of rectal bleeding.    03/20/2018 colonoscopy for screening purposes 2 tubular adenomatous polyps 3 to 4 mm in size descending and transverse colon otherwise unremarkable.  Recall 5 years  He states 3 weeks ago for a week and a half had intermittent rectal bleeding.  States for at least a year he has had intermittent rectal bleeding, every 4-5 months.  Will be dark red blood on stool and on TP.  No rectal pain, no burning, no itching.  Denies changes in his stools, no constipation, diarrhea.  During the BM with blood it is looser than normal.  No AB pain.  He has GERD, takes daily medication that controls it. No  nausea, vomiting, dysphagia.  No fatigue, SOB.  No family history of colon cancer.  Takes advil 2 pills daily, no ETOH.   He  reports that he has never smoked. He has never used smokeless tobacco. He reports current alcohol use. He reports that he does not use drugs.  Current Medications:     Current Outpatient Medications (Cardiovascular):    amLODipine (NORVASC) 10 MG tablet, TAKE 1 TABLET BY MOUTH EVERY DAY FOR BLOOD PRESSURE   carvedilol (COREG) 25 MG tablet, Take 25 mg by mouth 2 (two) times daily.   pravastatin (PRAVACHOL) 20 MG tablet, TAKE ONE TABLET BY MOUTH ONCE DAILY FOR CHOLESTEROL   telmisartan (MICARDIS) 80 MG tablet, Take 80 mg by mouth daily.   triamterene-hydrochlorothiazide (MAXZIDE-25) 37.5-25 MG tablet, TAKE 1/2-1 TABLET BY MOUTH EVERY MORNING   Current Outpatient Medications (Respiratory):    fluticasone (FLONASE) 50 MCG/ACT nasal spray, Place into the nose.   Current Outpatient Medications (Analgesics):    allopurinol (ZYLOPRIM) 300 MG tablet, Take 300 mg by mouth daily.   Meloxicam 15 MG TBDP, Take 1 tablet by mouth. As needed     Current Outpatient Medications (Other):    COVID-19 mRNA Vac-TriS, Pfizer, (PFIZER-BIONT COVID-19 VAC-TRIS) SUSP injection, Inject into the muscle.   cyclobenzaprine (FLEXERIL) 10 MG tablet, TAKE ONE TABLET BY MOUTH THREE TIMES DAILY AS NEEDED FOR MUSCLE SPASMS   omeprazole (PRILOSEC) 40 MG capsule, Take by mouth daily.   tamsulosin (FLOMAX) 0.4 MG CAPS capsule, Take 0.4 mg by mouth daily.  Current Facility-Administered Medications (Other):    0.9 %  sodium chloride  infusion  Medical History:  Past Medical History:  Diagnosis Date   Allergy    Arthritis    ankle   GERD (gastroesophageal reflux disease)    Hypertension    Allergies:  Allergies  Allergen Reactions   Penicillins      Surgical History:  He  has a past surgical history that includes Knee arthroscopy (Right); Ankle fracture surgery (Left); and  Colonoscopy. Family History:  His family history includes Heart disease in his father.  REVIEW OF SYSTEMS  : All other systems reviewed and negative except where noted in the History of Present Illness.  PHYSICAL EXAM: BP 128/68   Pulse 78   Ht 6' (1.829 m)   Wt 216 lb 8 oz (98.2 kg)   BMI 29.36 kg/m  General:   Pleasant, well developed male in no acute distress Head:   Normocephalic and atraumatic. Eyes:  sclerae anicteric,conjunctive pink  Heart:   regular rate and rhythm Pulm:  Clear anteriorly; no wheezing Abdomen:   Soft, Non-distended AB, Active bowel sounds. No tenderness . , No organomegaly appreciated. Rectal: declines- did not want rectal exam, not bleeding Extremities:  Without edema. Msk: Symmetrical without gross deformities. Peripheral pulses intact.  Neurologic:  Alert and  oriented x4;  No focal deficits.  Skin:   Dry and intact without significant lesions or rashes. Psychiatric:  Cooperative. Normal mood and affect.    Edmonia Gottron, PA-C 8:54 AM

## 2022-07-10 ENCOUNTER — Encounter: Payer: Self-pay | Admitting: Physician Assistant

## 2022-07-10 ENCOUNTER — Ambulatory Visit (INDEPENDENT_AMBULATORY_CARE_PROVIDER_SITE_OTHER): Payer: BC Managed Care – PPO | Admitting: Physician Assistant

## 2022-07-10 ENCOUNTER — Other Ambulatory Visit (INDEPENDENT_AMBULATORY_CARE_PROVIDER_SITE_OTHER): Payer: BC Managed Care – PPO

## 2022-07-10 VITALS — BP 128/68 | HR 78 | Ht 72.0 in | Wt 216.5 lb

## 2022-07-10 DIAGNOSIS — K625 Hemorrhage of anus and rectum: Secondary | ICD-10-CM

## 2022-07-10 DIAGNOSIS — K219 Gastro-esophageal reflux disease without esophagitis: Secondary | ICD-10-CM | POA: Diagnosis not present

## 2022-07-10 DIAGNOSIS — Z86010 Personal history of colonic polyps: Secondary | ICD-10-CM

## 2022-07-10 DIAGNOSIS — Z860101 Personal history of adenomatous and serrated colon polyps: Secondary | ICD-10-CM

## 2022-07-10 LAB — CBC WITH DIFFERENTIAL/PLATELET
Basophils Absolute: 0.1 10*3/uL (ref 0.0–0.1)
Basophils Relative: 1.3 % (ref 0.0–3.0)
Eosinophils Absolute: 0.2 10*3/uL (ref 0.0–0.7)
Eosinophils Relative: 4.4 % (ref 0.0–5.0)
HCT: 39.6 % (ref 39.0–52.0)
Hemoglobin: 13.2 g/dL (ref 13.0–17.0)
Lymphocytes Relative: 33.7 % (ref 12.0–46.0)
Lymphs Abs: 1.7 10*3/uL (ref 0.7–4.0)
MCHC: 33.2 g/dL (ref 30.0–36.0)
MCV: 85.1 fl (ref 78.0–100.0)
Monocytes Absolute: 0.4 10*3/uL (ref 0.1–1.0)
Monocytes Relative: 7.1 % (ref 3.0–12.0)
Neutro Abs: 2.7 10*3/uL (ref 1.4–7.7)
Neutrophils Relative %: 53.5 % (ref 43.0–77.0)
Platelets: 271 10*3/uL (ref 150.0–400.0)
RBC: 4.66 Mil/uL (ref 4.22–5.81)
RDW: 15.3 % (ref 11.5–15.5)
WBC: 5 10*3/uL (ref 4.0–10.5)

## 2022-07-10 MED ORDER — NA SULFATE-K SULFATE-MG SULF 17.5-3.13-1.6 GM/177ML PO SOLN: 1.0000 | Freq: Once | ORAL | 0 refills | Status: AC

## 2022-07-10 NOTE — Patient Instructions (Addendum)
_______________________________________________________  If you are age 60 or older, your body mass index should be between 23-30. Your Body mass index is 29.36 kg/m. If this is out of the aforementioned range listed, please consider follow up with your Primary Care Provider.  If you are age 68 or younger, your body mass index should be between 19-25. Your Body mass index is 29.36 kg/m. If this is out of the aformentioned range listed, please consider follow up with your Primary Care Provider.   ________________________________________________________  The [facility] providers would like to encourage you to use [facility] to communicate with providers for non-urgent requests or questions.  Due to long hold times on the telephone, sending your provider a message by [facility] may be a faster and more efficient way to get a response.  Please allow 48 business hours for a response.  Please remember that this is for non-urgent requests.  _______________________________________________________  Drew Martinez have been scheduled for a colonoscopy. Please follow written instructions given to you at your visit today.  Please pick up your prep supplies at the pharmacy within the next 1-3 days. If you use inhalers (even only as needed), please bring them with you on the day of your procedure.  Your provider has requested that you go to the basement level for lab work before leaving today. Press "B" on the elevator. The lab is located at the first door on the left as you exit the elevator.  We have sent the following medications to your pharmacy for you to pick up at your convenience: Suprep  It was a pleasure to see you today!  Thank you for trusting me with your gastrointestinal care!

## 2022-07-10 NOTE — Progress Notes (Signed)
Reviewed and agree with management plans. ? ?Sabriah Hobbins L. Sadeen Wiegel, MD, MPH  ?

## 2022-07-19 ENCOUNTER — Encounter (HOSPITAL_COMMUNITY): Payer: Self-pay | Admitting: Orthopedic Surgery

## 2022-07-19 ENCOUNTER — Other Ambulatory Visit (HOSPITAL_COMMUNITY): Payer: Self-pay | Admitting: Orthopedic Surgery

## 2022-07-19 ENCOUNTER — Ambulatory Visit (HOSPITAL_COMMUNITY)
Admission: RE | Admit: 2022-07-19 | Discharge: 2022-07-19 | Disposition: A | Discharge status: Home / Self Care | Payer: BC Managed Care – PPO | Source: Ambulatory Visit | Attending: Orthopedic Surgery | Admitting: Orthopedic Surgery

## 2022-07-19 DIAGNOSIS — R2242 Localized swelling, mass and lump, left lower limb: Secondary | ICD-10-CM

## 2022-08-24 ENCOUNTER — Encounter: Payer: BC Managed Care – PPO | Admitting: Gastroenterology

## 2022-08-30 ENCOUNTER — Encounter: Payer: Self-pay | Admitting: Gastroenterology

## 2022-09-07 ENCOUNTER — Encounter: Payer: BC Managed Care – PPO | Admitting: Gastroenterology

## 2022-10-10 ENCOUNTER — Encounter: Payer: BC Managed Care – PPO | Admitting: Gastroenterology

## 2022-10-22 ENCOUNTER — Encounter: Payer: Self-pay | Admitting: Certified Registered Nurse Anesthetist

## 2022-10-23 ENCOUNTER — Ambulatory Visit (AMBULATORY_SURGERY_CENTER): Payer: BC Managed Care – PPO | Admitting: Gastroenterology

## 2022-10-23 ENCOUNTER — Encounter: Payer: Self-pay | Admitting: Gastroenterology

## 2022-10-23 VITALS — BP 105/67 | HR 76 | Temp 96.8°F | Resp 24 | Ht 72.0 in | Wt 216.0 lb

## 2022-10-23 DIAGNOSIS — D122 Benign neoplasm of ascending colon: Secondary | ICD-10-CM

## 2022-10-23 DIAGNOSIS — K625 Hemorrhage of anus and rectum: Secondary | ICD-10-CM

## 2022-10-23 MED ORDER — SODIUM CHLORIDE 0.9 % IV SOLN: 500.0000 mL | Freq: Once | INTRAVENOUS | Status: DC

## 2022-10-23 NOTE — Op Note (Signed)
[place] Patient Name: Drew Martinez Procedure Date: 10/23/2022 3:47 PM MRN: [id number] Endoscopist: Thornton Park MD, MD, [id number] Age: 62 Referring MD:  Date of Birth: Nov 09, 1960 Gender: Male Account #: [id number] Procedure:                Colonoscopy Indications:              Rectal bleeding                           Normal colonoscopy with Dr. Ardis Hughs in 2009 with                            bleeding attributed to hemorrhoids.                           Colonoscopy with Dr. Ardis Hughs in 2019 showed 2                            tubular adenomas ranging from 3-4 mm in size.                            Recall recommended in 5 years. Medicines:                Monitored Anesthesia Care Procedure:                Pre-Anesthesia Assessment:                           - Prior to the procedure, a History and Physical                            was performed, and patient medications and                            allergies were reviewed. The patient's tolerance of                            previous anesthesia was also reviewed. The risks                            and benefits of the procedure and the sedation                            options and risks were discussed with the patient.                            All questions were answered, and informed consent                            was obtained. Prior Anticoagulants: The patient has                            taken no anticoagulant or antiplatelet agents. ASA  Grade Assessment: II - A patient with mild systemic                            disease. After reviewing the risks and benefits,                            the patient was deemed in satisfactory condition to                            undergo the procedure.                           After obtaining informed consent, the colonoscope                            was passed under direct vision. Throughout the                            procedure,  the patient's blood pressure, pulse, and                            oxygen saturations were monitored continuously. The                            CF [id number] was introduced through the anus                            and advanced to the 3 cm into the ileum. A second                            forward view of the right colon was performed. The                            colonoscopy was performed without difficulty. The                            patient tolerated the procedure well. The quality                            of the bowel preparation was good. The terminal                            ileum, ileocecal valve, appendiceal orifice, and                            rectum were photographed. Scope In: 4:01:11 PM Scope Out: 4:13:08 PM Scope Withdrawal Time: 0 hours 7 minutes 33 seconds  Total Procedure Duration: 0 hours 11 minutes 57 seconds  Findings:                 The perianal and digital rectal examinations were                            normal.  A 3 mm polyp was found in the ascending colon. The                            polyp was flat. The polyp was removed with a cold                            snare. Resection and retrieval were complete.                            Estimated blood loss was minimal.                           Multiple medium-mouthed and small-mouthed                            diverticula were found in the entire colon.                           Non-bleeding internal hemorrhoids were found.                           The exam was otherwise without abnormality on                            direct and retroflexion views. Complications:            No immediate complications. Estimated Blood Loss:     Estimated blood loss was minimal. Impression:               - One 3 mm polyp in the ascending colon, removed                            with a cold snare. Resected and retrieved.                           - Diverticulosis in the entire  examined colon.                           - Non-bleeding internal hemorrhoids. The likely                            source of recent bleeding.                           - The examination was otherwise normal on direct                            and retroflexion views. Recommendation:           - Patient has a contact number available for                            emergencies. The signs and symptoms of potential                            delayed  complications were discussed with the                            patient. Return to normal activities tomorrow.                            Written discharge instructions were provided to the                            patient.                           - High fiber diet.                           - Continue present medications.                           - Await pathology results.                           - Repeat colonoscopy in 7 years for surveillance.                           - If you would like more information,                            MyGIHealth.com and UpToDate.com have good                            information about hemorrhoids.                           - Emerging evidence supports eating a diet of                            fruits, vegetables, grains, calcium, and yogurt                            while reducing red meat and alcohol may reduce the                            risk of colon cancer.                           - Thank you for allowing me to be involved in your                            colon cancer prevention. Thornton Park MD, MD 10/23/2022 4:18:25 PM This report has been signed electronically.

## 2022-10-23 NOTE — Progress Notes (Signed)
Called to room to assist during endoscopic procedure.  Patient ID and intended procedure confirmed with present staff. Received instructions for my participation in the procedure from the performing physician.  

## 2022-10-23 NOTE — Progress Notes (Signed)
Referring Provider: Donnajean Lopes, MD Primary Care Physician:  Donnajean Lopes, MD  Indication for Colonoscopy:  Rectal bleeding   IMPRESSION:  Rectal bleeding Appropriate candidate for monitored anesthesia care  PLAN: Colonoscopy in the [place] today   HPI: Drew Martinez is a 62 y.o. male presents for colonoscopy to evaluate rectal bleeding.   Normal colonoscopy with Dr. Ardis Martinez in 2009 with bleeding attributed to hemorrhoids.   Colonoscopy with Dr. Ardis Martinez in 2019 showed 2 tubular adenomas ranging from 3-4 mm in size. Recall recommended in 5 years.   No known family history of colon cancer or polyps.    Past Medical History:  Diagnosis Date   Allergy    Arthritis    ankle   GERD (gastroesophageal reflux disease)    Hypertension     Past Surgical History:  Procedure Laterality Date   ANKLE FRACTURE SURGERY Left    1992   COLONOSCOPY     KNEE ARTHROSCOPY Right    1981    Current Outpatient Medications  Medication Sig Dispense Refill   allopurinol (ZYLOPRIM) 300 MG tablet Take 300 mg by mouth daily.     amLODipine (NORVASC) 10 MG tablet TAKE 1 TABLET BY MOUTH EVERY DAY FOR BLOOD PRESSURE  3   carvedilol (COREG) 25 MG tablet Take 25 mg by mouth 2 (two) times daily.  12   cetirizine (ZYRTEC ALLERGY) 10 MG tablet as directed Oral     cyclobenzaprine (FLEXERIL) 10 MG tablet TAKE ONE TABLET BY MOUTH THREE TIMES DAILY AS NEEDED FOR MUSCLE SPASMS  1   esomeprazole (NEXIUM) 40 MG capsule 1 tab po qd Oral     fluticasone (FLONASE) 50 MCG/ACT nasal spray Place into the nose.     Meloxicam 15 MG TBDP Take 1 tablet by mouth. As needed     omeprazole (PRILOSEC) 40 MG capsule Take by mouth daily.  3   pravastatin (PRAVACHOL) 20 MG tablet TAKE ONE TABLET BY MOUTH ONCE DAILY FOR CHOLESTEROL  2   tamsulosin (FLOMAX) 0.4 MG CAPS capsule Take 0.4 mg by mouth daily.  1   telmisartan (MICARDIS) 80 MG tablet Take 80 mg by mouth daily.  2   triamterene-hydrochlorothiazide  (MAXZIDE-25) 37.5-25 MG tablet TAKE 1/2-1 TABLET BY MOUTH EVERY MORNING  2   COVID-19 mRNA Vac-TriS, Pfizer, (PFIZER-BIONT COVID-19 VAC-TRIS) SUSP injection Inject into the muscle. 0.3 mL 0   ipratropium (ATROVENT) 0.06 % nasal spray _insert 2 SPRAYS IN EACH NOSTRIL THREE TIMES DAILY FOR 7 DAYS for 30     sildenafil (VIAGRA) 100 MG tablet 1 tablet as needed for erections Orally Once a day for 30 days     Current Facility-Administered Medications  Medication Dose Route Frequency Provider Last Rate Last Admin   0.9 %  sodium chloride infusion  500 mL Intravenous Once Drew Park, MD        Allergies as of 10/23/2022 - Review Complete 10/23/2022  Allergen Reaction Noted   Penicillins      Family History  Problem Relation Age of Onset   Heart disease Father    Colon cancer Neg Hx    Colon polyps Neg Hx    Esophageal cancer Neg Hx    Stomach cancer Neg Hx    Rectal cancer Neg Hx      Physical Exam: General:   Alert,  well-nourished, pleasant and cooperative in NAD Head:  Normocephalic and atraumatic. Eyes:  Sclera clear, no icterus.   Conjunctiva pink. Mouth:  No deformity or lesions.  Neck:  Supple; no masses or thyromegaly. Lungs:  Clear throughout to auscultation.   No wheezes. Heart:  Regular rate and rhythm; no murmurs. Abdomen:  Soft, non-tender, nondistended, normal bowel sounds, no rebound or guarding.  Msk:  Symmetrical. No boney deformities LAD: No inguinal or umbilical LAD Extremities:  No clubbing or edema. Neurologic:  Alert and  oriented x4;  grossly nonfocal Skin:  No obvious rash or bruise. Psych:  Alert and cooperative. Normal mood and affect.     Studies/Results: No results found.    Drew Stejskal L. Tarri Glenn, MD, MPH 10/23/2022, 3:50 PM

## 2022-10-23 NOTE — Progress Notes (Signed)
VS by DT  Pt's states no medical or surgical changes since previsit or office visit.  

## 2022-10-23 NOTE — Patient Instructions (Signed)
Discharge instructions given. Handouts on polyps and Hemorrhoids. Resume previous medications. YOU HAD AN ENDOSCOPIC PROCEDURE TODAY AT [facility]:   Refer to the procedure report that was given to you for any specific questions about what was found during the examination.  If the procedure report does not answer your questions, please call your gastroenterologist to clarify.  If you requested that your care partner not be given the details of your procedure findings, then the procedure report has been included in a sealed envelope for you to review at your convenience later.  YOU SHOULD EXPECT: Some feelings of bloating in the abdomen. Passage of more gas than usual.  Walking can help get rid of the air that was put into your GI tract during the procedure and reduce the bloating. If you had a lower endoscopy (such as a colonoscopy or flexible sigmoidoscopy) you may notice spotting of blood in your stool or on the toilet paper. If you underwent a bowel prep for your procedure, you may not have a normal bowel movement for a few days.  Please Note:  You might notice some irritation and congestion in your nose or some drainage.  This is from the oxygen used during your procedure.  There is no need for concern and it should clear up in a day or so.  SYMPTOMS TO REPORT IMMEDIATELY:  Following lower endoscopy (colonoscopy or flexible sigmoidoscopy):  Excessive amounts of blood in the stool  Significant tenderness or worsening of abdominal pains  Swelling of the abdomen that is new, acute  Fever of 100F or higher   For urgent or emergent issues, a gastroenterologist can be reached at any hour by calling [phone]. Do not use MyChart messaging for urgent concerns.    DIET:  We do recommend a small meal at first, but then you may proceed to your regular diet.  Drink plenty of fluids but you should avoid alcoholic beverages for 24 hours.  ACTIVITY:  You should plan to take it  easy for the rest of today and you should NOT DRIVE or use heavy machinery until tomorrow (because of the sedation medicines used during the test).    FOLLOW UP: Our staff will call the number listed on your records the next business day following your procedure.  We will call around 7:15- 8:00 am to check on you and address any questions or concerns that you may have regarding the information given to you following your procedure. If we do not reach you, we will leave a message.     If any biopsies were taken you will be contacted by phone or by letter within the next 1-3 weeks.  Please call us at [phone] if you have not heard about the biopsies in 3 weeks.    SIGNATURES/CONFIDENTIALITY: You and/or your care partner have signed paperwork which will be entered into your electronic medical record.  These signatures attest to the fact that that the information above on your After Visit Summary has been reviewed and is understood.  Full responsibility of the confidentiality of this discharge information lies with you and/or your care-partner.

## 2022-10-23 NOTE — Progress Notes (Signed)
Report given to PACU, vss 

## 2022-10-24 ENCOUNTER — Telehealth: Payer: Self-pay | Admitting: *Deleted

## 2022-10-24 NOTE — Telephone Encounter (Signed)
  Follow up Call-     10/23/2022    3:01 PM  Call back number  Post procedure Call Back phone  # [phone]  Permission to leave phone message Yes     Patient questions:  Do you have a fever, pain , or abdominal swelling? No. Pain Score  0 *  Have you tolerated food without any problems? Yes.    Have you been able to return to your normal activities? Yes.    Do you have any questions about your discharge instructions: Diet   No. Medications  No. Follow up visit  No.  Do you have questions or concerns about your Care? No.  Actions: * If pain score is 4 or above: No action needed, pain <4.

## 2022-10-27 ENCOUNTER — Encounter: Payer: Self-pay | Admitting: Gastroenterology

## 2024-01-24 ENCOUNTER — Other Ambulatory Visit: Payer: Self-pay | Admitting: Orthopedic Surgery

## 2024-01-24 DIAGNOSIS — T84032A Mechanical loosening of internal right knee prosthetic joint, initial encounter: Secondary | ICD-10-CM

## 2024-02-11 ENCOUNTER — Ambulatory Visit
Admission: RE | Admit: 2024-02-11 | Discharge: 2024-02-11 | Discharge status: Home / Self Care | Source: Ambulatory Visit | Attending: Orthopedic Surgery | Admitting: Orthopedic Surgery

## 2024-02-11 DIAGNOSIS — T84032A Mechanical loosening of internal right knee prosthetic joint, initial encounter: Secondary | ICD-10-CM

## 2024-04-29 ENCOUNTER — Ambulatory Visit (INDEPENDENT_AMBULATORY_CARE_PROVIDER_SITE_OTHER): Payer: Self-pay | Admitting: Internal Medicine

## 2024-04-29 ENCOUNTER — Other Ambulatory Visit: Payer: Self-pay

## 2024-04-29 ENCOUNTER — Encounter: Payer: Self-pay | Admitting: Internal Medicine

## 2024-04-29 VITALS — BP 118/70 | HR 70 | Temp 98.0°F | Resp 18 | Ht 72.0 in | Wt 205.9 lb

## 2024-04-29 DIAGNOSIS — M25561 Pain in right knee: Secondary | ICD-10-CM

## 2024-04-29 DIAGNOSIS — G8929 Other chronic pain: Secondary | ICD-10-CM

## 2024-04-29 DIAGNOSIS — Z96651 Presence of right artificial knee joint: Secondary | ICD-10-CM | POA: Diagnosis not present

## 2024-04-29 NOTE — Progress Notes (Signed)
 NEW PATIENT Date of Service/Encounter:  04/29/24 Referring provider: Edna Martinez LABOR, MD Primary care provider: Yolande Martinez MATSU, MD  Subjective:  Drew Martinez is a 63 y.o. male  presenting today for evaluation of metal allergy  History obtained from: chart review and patient.   Discussed the use of AI scribe software for clinical note transcription with the patient, who gave verbal consent to proceed.  History of Present Illness Drew Martinez is a 63 year old male who presents with concerns of possible implant allergy following a knee replacement.  Postoperative knee symptoms - Underwent knee replacement in December - Experiencing difficulty with healing since surgery - Persistent inflammation and pain in the knee - Swelling present, though less severe than previously - No rashes or redness over the implant area  Allergic and hypersensitivity reactions - No history of reactions to metals, including jewelry - No current systemic steroid use; only using nasal spray for allergies  Patient concerns regarding implant - Believes a full knee replacement was needed rather than the partial one received - Suspects that this is the main culprit for pain - Is not interested in pursuing metal testing currently       Other allergy screening: Asthma: no Rhino conjunctivitis: no Food allergy: no Medication allergy: no Hymenoptera allergy: no Urticaria: no Eczema:no History of recurrent infections suggestive of immunodeficency: no Vaccinations are up to date.   Past Medical History: Past Medical History:  Diagnosis Date   Allergy    Arthritis    ankle   GERD (gastroesophageal reflux disease)    Hypertension    Medication List:  Current Outpatient Medications  Medication Sig Dispense Refill   acetaminophen (TYLENOL) 650 MG CR tablet 2 tablets as needed Orally every 8 hrs     allopurinol (ZYLOPRIM) 300 MG tablet Take 300 mg by mouth daily.     amLODipine (NORVASC) 10  MG tablet TAKE 1 TABLET BY MOUTH EVERY DAY FOR BLOOD PRESSURE  3   carvedilol (COREG) 25 MG tablet Take 25 mg by mouth 2 (two) times daily.  12   cetirizine (ZYRTEC ALLERGY) 10 MG tablet as directed Oral     COVID-19 mRNA Vac-TriS, Pfizer, (PFIZER-BIONT COVID-19 VAC-TRIS) SUSP injection Inject into the muscle. 0.3 mL 0   cyclobenzaprine (FLEXERIL) 10 MG tablet TAKE ONE TABLET BY MOUTH THREE TIMES DAILY AS NEEDED FOR MUSCLE SPASMS  1   esomeprazole (NEXIUM) 40 MG capsule 1 tab po qd Oral     fluticasone (FLONASE) 50 MCG/ACT nasal spray Place into the nose.     ipratropium (ATROVENT) 0.06 % nasal spray _insert 2 SPRAYS IN EACH NOSTRIL THREE TIMES DAILY FOR 7 DAYS for 30     meloxicam (MOBIC) 15 MG tablet Take 15 mg by mouth daily.     Meloxicam 15 MG TBDP Take 1 tablet by mouth. As needed     omeprazole (PRILOSEC) 40 MG capsule Take by mouth daily.  3   pravastatin (PRAVACHOL) 20 MG tablet TAKE ONE TABLET BY MOUTH ONCE DAILY FOR CHOLESTEROL  2   sildenafil (VIAGRA) 100 MG tablet 1 tablet as needed for erections Orally Once a day for 30 days     tamsulosin (FLOMAX) 0.4 MG CAPS capsule Take 0.4 mg by mouth daily.  1   telmisartan (MICARDIS) 80 MG tablet Take 80 mg by mouth daily.  2   triamterene-hydrochlorothiazide (MAXZIDE-25) 37.5-25 MG tablet TAKE 1/2-1 TABLET BY MOUTH EVERY MORNING  2   No current facility-administered medications for this visit.  Known Allergies:  Allergies  Allergen Reactions   Penicillin G Sodium Dermatitis   Penicillins    Penicillin G Dermatitis   Past Surgical History: Past Surgical History:  Procedure Laterality Date   ANKLE FRACTURE SURGERY Left    1992   COLONOSCOPY     KNEE ARTHROSCOPY Right    1981   Family History: Family History  Problem Relation Age of Onset   Allergic rhinitis Mother    Heart disease Father    Asthma Brother    Colon cancer Neg Hx    Colon polyps Neg Hx    Esophageal cancer Neg Hx    Stomach cancer Neg Hx    Rectal cancer  Neg Hx    Social History: Koi lives single-family home this 63 years old.  Hardwood flooring carpet in bedroom.  Electric heating central cooling.  No roaches in the house and bed is 2 feet off floor.  Dust mite precautions on bed but not pillows.  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   ROS:  All other systems negative except as noted per HPI.  Objective:  Blood pressure 118/70, pulse 70, temperature 98 F (36.7 C), temperature source Temporal, resp. rate 18, height 6' (1.829 m), weight 205 lb 14.4 oz (93.4 kg), SpO2 96%. Body mass index is 27.93 kg/m. Physical Exam:  General Appearance:  Alert, cooperative, no distress, appears stated age  Head:  Normocephalic, without obvious abnormality, atraumatic  Eyes:  Conjunctiva clear, EOM's intact  Ears EACs normal bilaterally  Nose: Nares normal, no rhinorrhea   Throat: Lips, tongue normal; teeth and gums normal, MMM  Neck: Supple, symmetrical  Lungs:    , Respirations unlabored, no coughing  Heart:   , Appears well perfused  Extremities: No edema  Skin: Large ventral scar on right knee, no rash, mild edema , Skin color, texture, turgor normal, and no rashes or lesions on visualized portions of skin  Neurologic: No gross deficits   Diagnostics: None done    Labs:  Lab Orders  No laboratory test(s) ordered today     Assessment and Plan  Assessment and Plan Assessment & Plan Evaluation for possible hypersensitivity reaction to right knee prosthesis Inflammation and pain post-knee replacement likely not due to hypersensitivity. Absence of rash or redness suggests low likelihood of implant allergy.  - Offer patch testing for metal allergy if symptoms persist or if he opts for testing. - Schedule patch testing at [facility] clinic if testing is chosen. - Ensure prednisone is discontinued for at least four weeks before testing. - Advise follow-up if symptoms change or if testing is pursued.  Follow up: as  needed       This note in its entirety was forwarded to the Provider who requested this consultation.  Other:    Thank you for your kind referral. I appreciate the opportunity to take part in Drew Martinez's care. Please do not hesitate to contact me with questions.  Sincerely,  Thank you so much for letting me partake in your care today.  Don't hesitate to reach out if you have any additional concerns!  Hargis Springer, MD  [facility] [place], [place]

## 2024-04-29 NOTE — Patient Instructions (Signed)
 Evaluation for possible hypersensitivity reaction to right knee prosthesis Inflammation and pain post-knee replacement likely not due to hypersensitivity. Absence of rash or redness suggests low likelihood of implant allergy.  - Offer patch testing for metal allergy if symptoms persist or if he opts for testing. - Schedule patch testing at [facility] clinic if testing is chosen. - Ensure prednisone is discontinued for at least four weeks before testing. - Advise follow-up if symptoms change or if testing is pursued.  Follow up: as needed

## 2024-07-15 ENCOUNTER — Other Ambulatory Visit: Payer: Self-pay | Admitting: Orthopedic Surgery

## 2024-07-15 DIAGNOSIS — M1711 Unilateral primary osteoarthritis, right knee: Secondary | ICD-10-CM

## 2024-07-17 NOTE — Progress Notes (Shared)
 Chief Complaint: Patient was seen in consultation today for right knee pain.   Referring Physician(s): Marchwiany,Daniel A  History of Present Illness: Drew Martinez is a 63 y.o. male with a medical history significant for bilateral knee osteoarthritis with chronic knee pain. He has a history of partial right knee replacement and the patient continues to struggle with swelling and stiffness. The patient also has left knee pain and Dr. Edna is planning for left knee partial arthroplasty. Dr. Edna recommended geniculate artery embolization for the right knee to help alleviate residual swelling due to neovascularization.    He has been kindly referred to Interventional Radiology to discuss right geniculate artery embolization as a potential treatment option.   [place] Pain Score =  VAS Pain Score =    Past Medical History:  Diagnosis Date   Allergy    Arthritis    ankle   GERD (gastroesophageal reflux disease)    Hypertension     Past Surgical History:  Procedure Laterality Date   ANKLE FRACTURE SURGERY Left    1992   COLONOSCOPY     KNEE ARTHROSCOPY Right    1981    Allergies: Penicillin g sodium, Penicillins, and Penicillin g  Medications: Prior to Admission medications   Medication Sig Start Date End Date Taking? Authorizing Provider  acetaminophen (TYLENOL) 650 MG CR tablet 2 tablets as needed Orally every 8 hrs 10/22/23   [provider]  allopurinol (ZYLOPRIM) 300 MG tablet Take 300 mg by mouth daily.    [provider]  amLODipine (NORVASC) 10 MG tablet TAKE 1 TABLET BY MOUTH EVERY DAY FOR BLOOD PRESSURE 02/06/18   [provider]  carvedilol (COREG) 25 MG tablet Take 25 mg by mouth 2 (two) times daily. 02/08/18   [provider]  cetirizine (ZYRTEC ALLERGY) 10 MG tablet as directed Oral 12/03/14   [provider]  COVID-19 mRNA Vac-TriS, Pfizer, (PFIZER-BIONT COVID-19 VAC-TRIS) SUSP injection Inject into the  muscle. 01/10/21   Luiz Channel, MD  cyclobenzaprine (FLEXERIL) 10 MG tablet TAKE ONE TABLET BY MOUTH THREE TIMES DAILY AS NEEDED FOR MUSCLE SPASMS 02/27/18   [provider]  esomeprazole (NEXIUM) 40 MG capsule 1 tab po qd Oral 09/30/09   [provider]  fluticasone (FLONASE) 50 MCG/ACT nasal spray Place into the nose.    [provider]  ipratropium (ATROVENT) 0.06 % nasal spray _insert 2 SPRAYS IN EACH NOSTRIL THREE TIMES DAILY FOR 7 DAYS for 30    [provider]  meloxicam (MOBIC) 15 MG tablet Take 15 mg by mouth daily. 03/28/24   [provider]  Meloxicam 15 MG TBDP Take 1 tablet by mouth. As needed    [provider]  omeprazole (PRILOSEC) 40 MG capsule Take by mouth daily. 12/28/17   [provider]  pravastatin (PRAVACHOL) 20 MG tablet TAKE ONE TABLET BY MOUTH ONCE DAILY FOR CHOLESTEROL 02/08/18   [provider]  sildenafil (VIAGRA) 100 MG tablet 1 tablet as needed for erections Orally Once a day for 30 days 10/19/11   [provider]  tamsulosin (FLOMAX) 0.4 MG CAPS capsule Take 0.4 mg by mouth daily. 02/08/18   [provider]  telmisartan (MICARDIS) 80 MG tablet Take 80 mg by mouth daily. 02/08/18   [provider]  triamterene-hydrochlorothiazide (MAXZIDE-25) 37.5-25 MG tablet TAKE 1/2-1 TABLET BY MOUTH EVERY MORNING 01/01/18   [provider]     Family History  Problem Relation Age of Onset   Allergic rhinitis Mother  Heart disease Father    Asthma Brother    Colon cancer Neg Hx    Colon polyps Neg Hx    Esophageal cancer Neg Hx    Stomach cancer Neg Hx    Rectal cancer Neg Hx     Social History   Socioeconomic History   Marital status: Married    Spouse name: Not on file   Number of children: Not on file   Years of education: Not on file   Highest education level: Not on file  Occupational History   Not on file  Tobacco Use   Smoking status: Never   Smokeless  tobacco: Never  Vaping Use   Vaping status: Never Used  Substance and Sexual Activity   Alcohol use: Yes    Comment: socially   Drug use: Never   Sexual activity: Not on file  Other Topics Concern   Not on file  Social History Narrative   ** Merged History Encounter **       Social Drivers of Health   Financial Resource Strain: Not on file  Food Insecurity: Not on file  Transportation Needs: Not on file  Physical Activity: Not on file  Stress: Not on file  Social Connections: Not on file   Review of Systems: A 12 point ROS discussed and pertinent positives are indicated in the HPI above.  All other systems are negative.  Review of Systems  Vital Signs: There were no vitals taken for this visit.    Physical Exam  Imaging:  CT right knee 02/11/24    IMPRESSION: 1. Mild degenerative change without acute bony injury. 2. Unremarkable medial compartment arthroplasty. Further assessment with bone scan if clinically warranted. 3. Mild to moderate joint effusion.  Labs:  CBC: No results for input(s): WBC, HGB, HCT, PLT in the last 8760 hours.  COAGS: No results for input(s): INR, APTT in the last 8760 hours.  BMP: No results for input(s): NA, K, CL, CO2, GLUCOSE, BUN, CALCIUM, CREATININE, GFRNONAA, GFRAA in the last 8760 hours.  Invalid input(s): CMP  LIVER FUNCTION TESTS: No results for input(s): BILITOT, AST, ALT, ALKPHOS, PROT, ALBUMIN in the last 8760 hours.  TUMOR MARKERS: No results for input(s): AFPTM, CEA, CA199, CHROMGRNA in the last 8760 hours.  Assessment and Plan:  63 year old male with a history of right knee osteoarthritis s/p partial knee arthroplasty with chronic right knee pain, swelling and stiffness.   Thank you for this interesting consult.  I greatly enjoyed meeting Catalino Plascencia and look forward to participating in their care.  A copy of this report was sent to the requesting provider on  this date.  Ester Sides, MD Pager: [phone]    I spent a total of  40 Minutes   in face to face in clinical consultation, greater than 50% of which was counseling/coordinating care for right knee pain.

## 2024-07-18 ENCOUNTER — Ambulatory Visit
Admission: RE | Admit: 2024-07-18 | Discharge: 2024-07-18 | Disposition: A | Discharge status: Home / Self Care | Payer: Self-pay | Source: Ambulatory Visit | Attending: Orthopedic Surgery | Admitting: Orthopedic Surgery

## 2024-07-18 DIAGNOSIS — M1711 Unilateral primary osteoarthritis, right knee: Secondary | ICD-10-CM

## 2024-07-18 HISTORY — PX: IR RADIOLOGIST EVAL & MGMT: IMG5224

## 2024-08-05 ENCOUNTER — Other Ambulatory Visit: Payer: Self-pay | Admitting: Interventional Radiology

## 2024-08-05 DIAGNOSIS — M1711 Unilateral primary osteoarthritis, right knee: Secondary | ICD-10-CM

## 2024-08-11 ENCOUNTER — Telehealth: Payer: Self-pay

## 2024-08-11 MED ORDER — METHYLPREDNISOLONE 4 MG PO TBPK: ORAL_TABLET | ORAL | 0 refills | Status: AC

## 2024-08-11 NOTE — Progress Notes (Signed)
 See telephone note

## 2024-08-11 NOTE — Discharge Instructions (Signed)

## 2024-08-13 ENCOUNTER — Ambulatory Visit
Admission: RE | Admit: 2024-08-13 | Discharge: 2024-08-13 | Disposition: A | Source: Ambulatory Visit | Attending: Interventional Radiology

## 2024-08-13 DIAGNOSIS — M1711 Unilateral primary osteoarthritis, right knee: Secondary | ICD-10-CM

## 2024-08-13 HISTORY — PX: IR EMBO ARTERIAL NOT HEMORR HEMANG INC GUIDE ROADMAPPING: IMG5448

## 2024-08-13 MED ORDER — NITROGLYCERIN 1 MG/10 ML FOR IR/CATH LAB
100.0000 ug | INTRA_ARTERIAL | Status: DC | PRN
  Administered 2024-08-13 (×2): 100 ug via INTRA_ARTERIAL

## 2024-08-13 MED ORDER — FENTANYL CITRATE (PF) 50 MCG/ML IJ SOSY: 25.0000 ug | PREFILLED_SYRINGE | INTRAMUSCULAR | Status: DC | PRN

## 2024-08-13 MED ORDER — MIDAZOLAM HCL (PF) 2 MG/2ML IJ SOLN
INTRAMUSCULAR | Status: AC | PRN
  Administered 2024-08-13: 1 mg via INTRAVENOUS

## 2024-08-13 MED ORDER — KETOROLAC TROMETHAMINE 30 MG/ML IJ SOLN
30.0000 mg | INTRAMUSCULAR | Status: AC
  Administered 2024-08-13: 30 mg via INTRAVENOUS

## 2024-08-13 MED ORDER — MIDAZOLAM HCL (PF) 2 MG/2ML IJ SOLN: 1.0000 mg | INTRAMUSCULAR | Status: DC | PRN

## 2024-08-13 MED ORDER — ACETAMINOPHEN 10 MG/ML IV SOLN
1000.0000 mg | Freq: Once | INTRAVENOUS | Status: AC
  Administered 2024-08-13: 1000 mg via INTRAVENOUS

## 2024-08-13 MED ORDER — FENTANYL CITRATE (PF) 100 MCG/2ML IJ SOLN
INTRAMUSCULAR | Status: AC | PRN
  Administered 2024-08-13: 50 ug via INTRAVENOUS

## 2024-08-13 MED ORDER — LIDOCAINE-EPINEPHRINE 1 %-1:100000 IJ SOLN
10.0000 mL | Freq: Once | INTRAMUSCULAR | Status: AC
  Administered 2024-08-13: 10 mL via INTRADERMAL

## 2024-08-13 MED ORDER — DEXAMETHASONE SOD PHOSPHATE PF 10 MG/ML IJ SOLN
10.0000 mg | Freq: Once | INTRAMUSCULAR | Status: AC
  Administered 2024-08-13: 10 mg via INTRAVENOUS

## 2024-08-13 MED ORDER — IIOPAMIDOL (ISOVUE-250) INJECTION 51%
100.0000 mL | Freq: Once | INTRAVENOUS | Status: AC | PRN
  Administered 2024-08-13: 50 mL via INTRA_ARTERIAL

## 2024-08-13 MED ORDER — SODIUM CHLORIDE 0.9 % IV SOLN: INTRAVENOUS | Status: DC

## 2024-08-13 NOTE — H&P (Addendum)
 Chief Complaint: Patient was seen in consultation today for R GAE  at the request of Drew Martinez  Referring Physician(s): Drew Martinez  History of Present Illness: Drew Martinez is a 63 y.o. male with bilateral knee OA with chronic pain, HTN, GERD. d Dr. Edna is planning for left knee partial arthroplasty in December, and the patient has previous undergone partial R knee replacement. He was referred to IR clinic for consideration for R GAE  to help alleviate residual swelling due to neovascularization.  He was evaluated  08/13/24 by Dr. Jennefer and deemed a candidate. Today, Drew Martinez returns for procedure under moderate sedation at clinic.   He has been NPO since MN and has a ride post procedure. Does not use supplemental home O2 or CPAP. He does not take AC/AP.   Denies any significant change in R lateral knee pain since clinic appointment. Denies fever, chills, blood in urine or stool, CP, SOB, sore throat, cough.     Past Medical History:  Diagnosis Date   Allergy    Arthritis    ankle   GERD (gastroesophageal reflux disease)    Hypertension     Past Surgical History:  Procedure Laterality Date   ANKLE FRACTURE SURGERY Left    1992   COLONOSCOPY     IR RADIOLOGIST EVAL & MGMT  07/18/2024   KNEE ARTHROSCOPY Right    1981    Allergies: Penicillin g sodium, Penicillins, and Penicillin g  Medications: Prior to Admission medications   Medication Sig Start Date End Date Taking? Authorizing Provider  acetaminophen (TYLENOL) 650 MG CR tablet 2 tablets as needed Orally every 8 hrs 10/22/23   [provider]  allopurinol (ZYLOPRIM) 300 MG tablet Take 300 mg by mouth daily.    [provider]  amLODipine (NORVASC) 10 MG tablet TAKE 1 TABLET BY MOUTH EVERY DAY FOR BLOOD PRESSURE 02/06/18   [provider]  carvedilol (COREG) 25 MG tablet Take 25 mg by mouth 2 (two) times daily. 02/08/18   [provider]  cetirizine (ZYRTEC  ALLERGY) 10 MG tablet as directed Oral 12/03/14   [provider]  COVID-19 mRNA Vac-TriS, Pfizer, (PFIZER-BIONT COVID-19 VAC-TRIS) SUSP injection Inject into the muscle. 01/10/21   Luiz Channel, MD  cyclobenzaprine (FLEXERIL) 10 MG tablet TAKE ONE TABLET BY MOUTH THREE TIMES DAILY AS NEEDED FOR MUSCLE SPASMS 02/27/18   [provider]  esomeprazole (NEXIUM) 40 MG capsule 1 tab po qd Oral 09/30/09   [provider]  fluticasone (FLONASE) 50 MCG/ACT nasal spray Place into the nose.    [provider]  ipratropium (ATROVENT) 0.06 % nasal spray _insert 2 SPRAYS IN EACH NOSTRIL THREE TIMES DAILY FOR 7 DAYS for 30    [provider]  meloxicam (MOBIC) 15 MG tablet Take 15 mg by mouth daily. 03/28/24   [provider]  Meloxicam 15 MG TBDP Take 1 tablet by mouth. As needed    [provider]  methylPREDNISolone  (MEDROL  DOSEPAK) 4 MG TBPK tablet Please take as prescribed by pharmacy 08/13/24   Drew Ester PARAS, MD  omeprazole (PRILOSEC) 40 MG capsule Take by mouth daily. 12/28/17   [provider]  pravastatin (PRAVACHOL) 20 MG tablet TAKE ONE TABLET BY MOUTH ONCE DAILY FOR CHOLESTEROL 02/08/18   [provider]  sildenafil (VIAGRA) 100 MG tablet 1 tablet as needed for erections Orally Once a day for 30 days 10/19/11   [provider]  tamsulosin (FLOMAX) 0.4 MG CAPS capsule  Take 0.4 mg by mouth daily. 02/08/18   [provider]  telmisartan (MICARDIS) 80 MG tablet Take 80 mg by mouth daily. 02/08/18   [provider]  triamterene-hydrochlorothiazide (MAXZIDE-25) 37.5-25 MG tablet TAKE 1/2-1 TABLET BY MOUTH EVERY MORNING 01/01/18   [provider]     Family History  Problem Relation Age of Onset   Allergic rhinitis Mother    Heart disease Father    Asthma Brother    Colon cancer Neg Hx    Colon polyps Neg Hx    Esophageal cancer Neg Hx    Stomach cancer Neg Hx    Rectal cancer Neg Hx      Social History   Socioeconomic History   Marital status: Married    Spouse name: Not on file   Number of children: Not on file   Years of education: Not on file   Highest education level: Not on file  Occupational History   Not on file  Tobacco Use   Smoking status: Never   Smokeless tobacco: Never  Vaping Use   Vaping status: Never Used  Substance and Sexual Activity   Alcohol use: Yes    Comment: socially   Drug use: Never   Sexual activity: Not on file  Other Topics Concern   Not on file  Social History Narrative   ** Merged History Encounter **       Social Drivers of Health   Financial Resource Strain: Not on file  Food Insecurity: Not on file  Transportation Needs: Not on file  Physical Activity: Not on file  Stress: Not on file  Social Connections: Not on file      Review of Systems: A 12 point ROS discussed and pertinent positives are indicated in the HPI above.  All other systems are negative.  Vital Signs: BP 137/89 (BP Location: Left Arm, Patient Position: Sitting, Cuff Size: Normal)   Pulse 80   Temp 98.5 F (36.9 C) (Oral)   Resp 18   SpO2 96%     Physical Exam HENT:     Mouth/Throat:     Mouth: Mucous membranes are moist.     Pharynx: Oropharynx is clear.  Cardiovascular:     Rate and Rhythm: Normal rate and regular rhythm.     Pulses: Normal pulses.     Heart sounds: Normal heart sounds.     Comments: Bilateral pedal pulses 2+ Pulmonary:     Effort: Pulmonary effort is normal.     Breath sounds: Normal breath sounds.  Musculoskeletal:     Right lower leg: No edema.     Left lower leg: No edema.     Comments: No rash or wounds BLE  Skin:    General: Skin is warm and dry.  Neurological:     Mental Status: He is alert and oriented to person, place, and time.     Sensory: No sensory deficit.     Motor: No weakness.  Psychiatric:        Mood and Affect: Mood normal.        Behavior: Behavior normal.        Thought Content:  Thought content normal.        Judgment: Judgment normal.     Mallampati Score:  MD Evaluation Airway: WNL Heart: WNL Abdomen: WNL Chest/ Lungs: WNL ASA  Classification: 3 Mallampati/Airway Score: Two  Imaging: IR Radiologist Eval & Mgmt Result Date: 07/18/2024 EXAM: NEW PATIENT OFFICE VISIT CHIEF COMPLAINT: See Epic note.  HISTORY OF PRESENT ILLNESS: See Epic note. REVIEW OF SYSTEMS: See Epic note. PHYSICAL EXAMINATION: See Epic note. ASSESSMENT AND PLAN: See Epic note. Ester Sides, MD [facility] Electronically Signed   By: Ester Sides M.D.   On: 07/18/2024 08:29    Labs:  CBC: No results for input(s): WBC, HGB, HCT, PLT in the last 8760 hours.  COAGS: No results for input(s): INR, APTT in the last 8760 hours.  BMP: No results for input(s): NA, K, CL, CO2, GLUCOSE, BUN, CALCIUM, CREATININE, GFRNONAA, GFRAA in the last 8760 hours.  Invalid input(s): CMP  LIVER FUNCTION TESTS: No results for input(s): BILITOT, AST, ALT, ALKPHOS, PROT, ALBUMIN in the last 8760 hours.  TUMOR MARKERS: No results for input(s): AFPTM, CEA, CA199, CHROMGRNA in the last 8760 hours.  Assessment and Plan:  Patient arriving for R GAE with Dr. Sides via R femoral antegrade approach. He is prepared for sedation. VSS. There are no contraindications to proceed based on PE and ROS interview. Procedure to be completed in IR suite at clinic today.   The Risks and benefits of geniculate artery embolization were discussed with the patient including, but not limited to bleeding, infection, vascular injury, post operative pain, or contrast induced renal failure.  This procedure involves the use of X-rays and because of the nature of the planned procedure, it is possible that we will have prolonged use of X-ray fluoroscopy.  Potential radiation risks to you include (but are not limited to) the  following: - A slightly elevated risk for cancer several years later in life. This risk is typically less than 0.5% percent. This risk is low in comparison to the normal incidence of human cancer, which is 33% for women and 50% for men according to the [employer] Society. - Radiation induced injury can include skin redness, resembling a rash, tissue breakdown / ulcers and hair loss (which can be temporary or permanent).   The likelihood of either of these occurring depends on the difficulty of the procedure and whether you are sensitive to radiation due to previous procedures, disease, or genetic conditions.   IF your procedure requires a prolonged use of radiation, you will be notified and given written instructions for further action.  It is your responsibility to monitor the irradiated area for the 2 weeks following the procedure and to notify your physician if you are concerned that you have suffered a radiation induced injury.    All of the patient's questions were answered, patient is agreeable to proceed. Consent signed and in chart.     Thank you for this interesting consult.  I greatly enjoyed meeting Felder Lebeda and look forward to participating in their care.  A copy of this report was sent to the requesting provider on this date.  Electronically Signed: Kelyn Koskela 08/13/2024, 8:40 AM   I spent a total of    10 Minutes in face to face in clinical consultation, greater than 50% of which was counseling/coordinating care for R GAE.

## 2024-08-13 NOTE — Procedures (Signed)
 Interventional Radiology Procedure Note  Procedure: Right geniculate artery embolization  Findings: Please refer to procedural dictation for full description. Right proximal SFA access, 4 Fr.  Complications: None immediate  Estimated Blood Loss: < 5 mL  Recommendations: IR will arrange 1 month outpatient follow up.   Ester Sides, MD

## 2024-08-14 ENCOUNTER — Telehealth: Payer: Self-pay

## 2024-08-14 NOTE — Progress Notes (Signed)
 See telephone note  Pt states he has medrol  dose pack, but has not started at this time because he has minimal to no pain.  Asked if it was mandatory to take, RN stated that it is his choice, but if pain, swelling etc start he may want to take med.  Also, encouraged to still take it easy on knee, rest, elevate, and ice if necessary.  Don't overdue it.

## 2024-10-07 ENCOUNTER — Other Ambulatory Visit: Payer: Self-pay | Admitting: Interventional Radiology

## 2024-10-07 DIAGNOSIS — M1711 Unilateral primary osteoarthritis, right knee: Secondary | ICD-10-CM
